# Patient Record
Sex: Male | Born: 1960 | Race: White | Hispanic: No | Marital: Married | Smoking: Never smoker
Health system: Southern US, Academic
[De-identification: ages and names within clinical notes are randomized; demographics above are authoritative.]

## PROBLEM LIST (undated history)

## (undated) ENCOUNTER — Inpatient Hospital Stay (HOSPITAL_COMMUNITY): Payer: 59 | Admitting: PULMONARY DISEASE

## (undated) DIAGNOSIS — K219 Gastro-esophageal reflux disease without esophagitis: Secondary | ICD-10-CM

## (undated) DIAGNOSIS — I10 Essential (primary) hypertension: Secondary | ICD-10-CM

## (undated) HISTORY — PX: KNEE SURGERY: SHX244

## (undated) HISTORY — PX: EYE SURGERY: SHX253

## (undated) HISTORY — PX: HX AORTIC VALVE REPLACEMENT: SHX41

## (undated) HISTORY — PX: FOOT SURGERY: SHX648

## (undated) HISTORY — DX: Gastro-esophageal reflux disease without esophagitis: K21.9

## (undated) HISTORY — PX: HX GALL BLADDER SURGERY/CHOLE: SHX55

## (undated) HISTORY — DX: Essential (primary) hypertension: I10

---

## 1993-11-16 ENCOUNTER — Other Ambulatory Visit (HOSPITAL_COMMUNITY): Payer: Self-pay | Admitting: FAMILY PRACTICE

## 2020-09-09 IMAGING — CT CT ABDOMEN & PELVIS WITH CONTRAST
1 of 2 series · 13 of 32 positions shown, 17 images · IV contrast (CONTRAST)
Comparison: None.
COMPARISON: None.

------------- REPORT [id number] -------------
EXAM:  [id number]   CT ABDOMEN & PELVIS WITH CONTRAST
INDICATION: 59-year-old with persistent right-sided abdominal pain.  Previous history of hernia repair.  No prior history of malignancy.
TECHNIQUE: CT was performed after oral contrast and intravenous administration of 75 mL Optiray 350 and reviewed in multiple projections.  Exam was performed using one or more of the following dose reduction techniques: Automated exposure control, adjustment of the mA and/or kV according to patient size, or the use of iterative reconstruction technique. Radiation dose 1903 mGy cm.
TECHNIQUE: CT was performed after oral contrast and intravenous administration of 75 mL Optiray 350 and reviewed in multiple projections.  Exam was performed using one or more of the following dose reduction techniques: Automated exposure control, adjustment of the mA and/or kV according to patient size, or the use of iterative reconstruction technique. Radiation dose 4567 mGy cm.

[delay · axial · delayed · 0.90mm/px · z∈[-989,-649]mm · 13 of 188 slices shown, 17 images]
[im 9/188  soft-tissue]
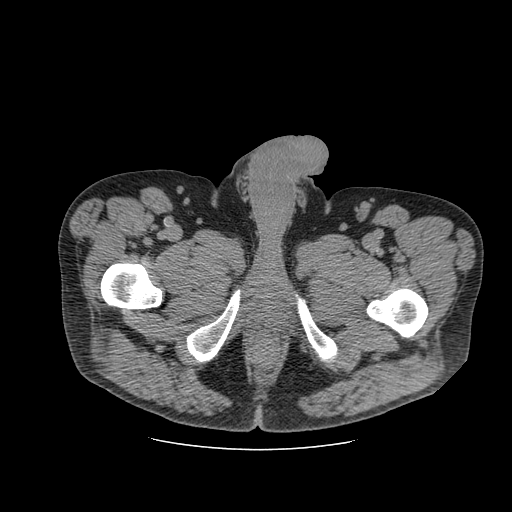
[im 9/188  bone]
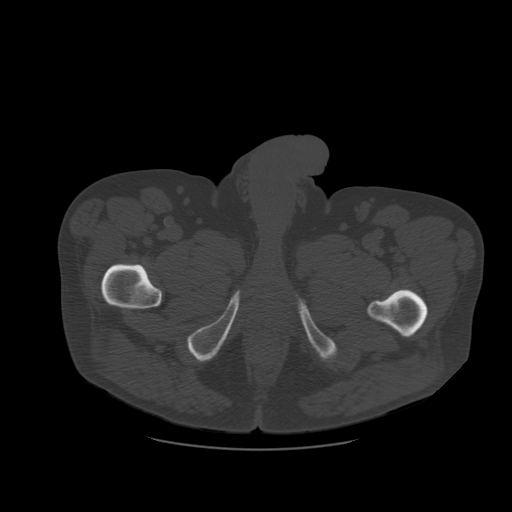
[im 27/188  soft-tissue]
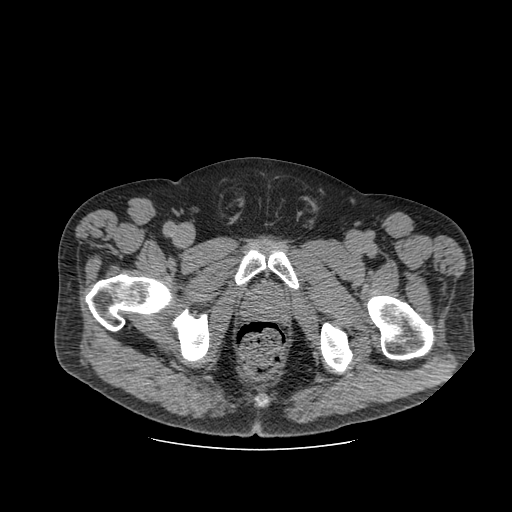
[im 45/188  soft-tissue]
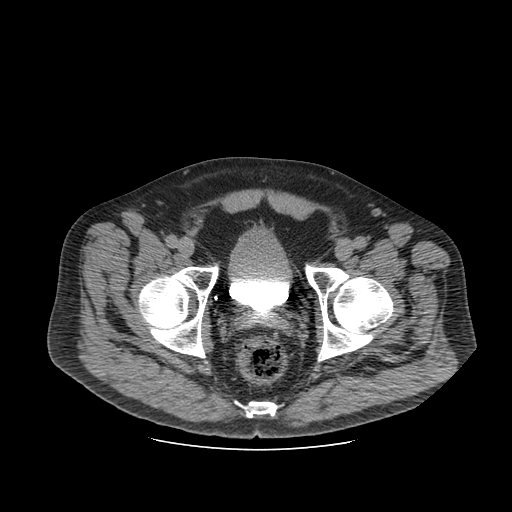
[im 63/188  soft-tissue]
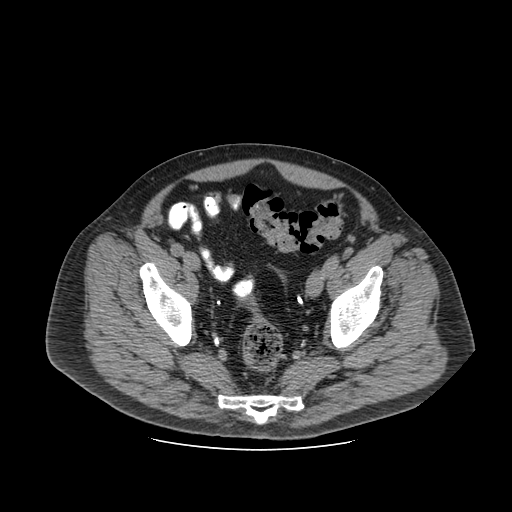
[im 81/188  soft-tissue]
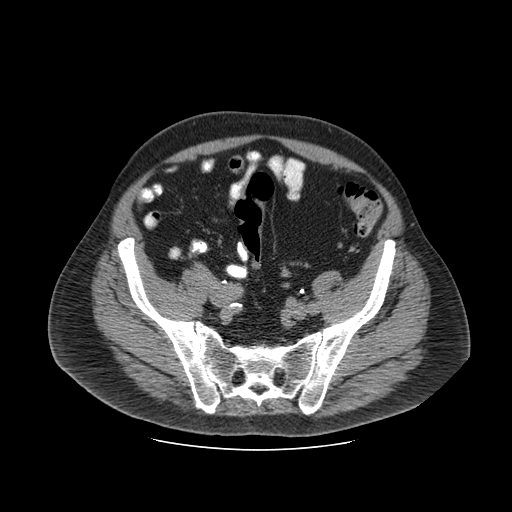
[im 98/188  soft-tissue]
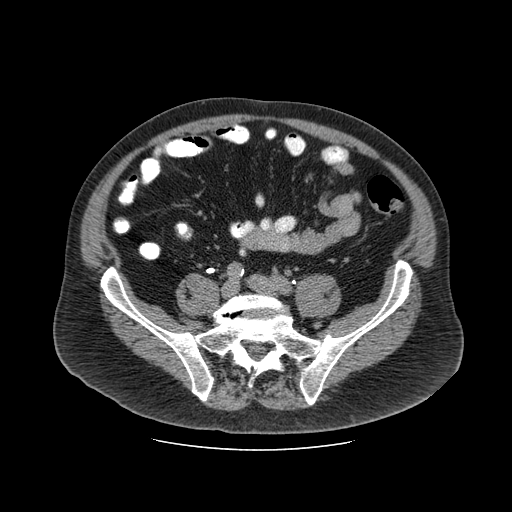
[im 107/188  soft-tissue]
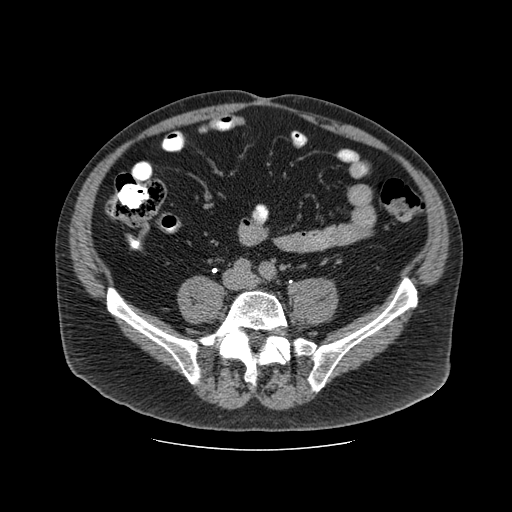
[im 125/188  soft-tissue]
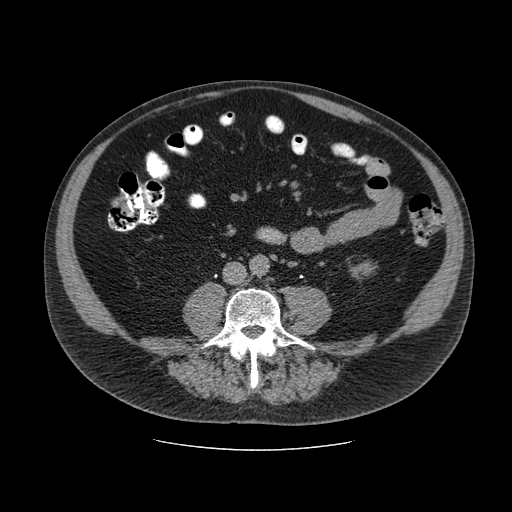
[im 143/188  soft-tissue]
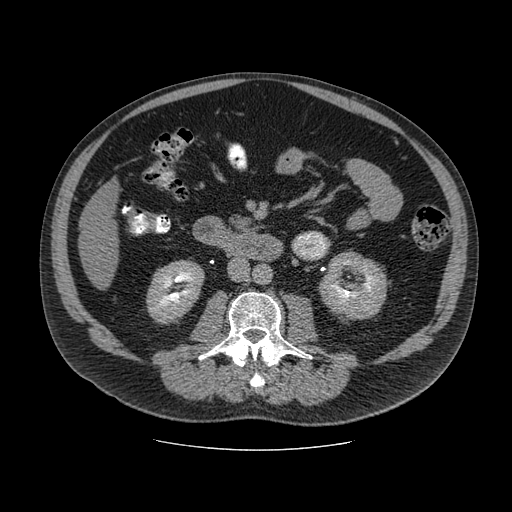
[im 143/188  bone]
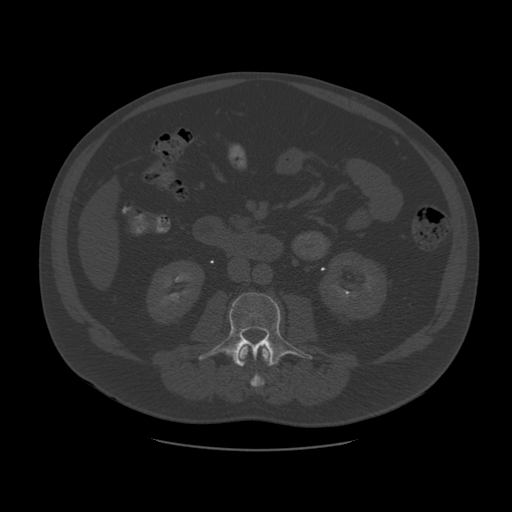
[im 152/188  lung]
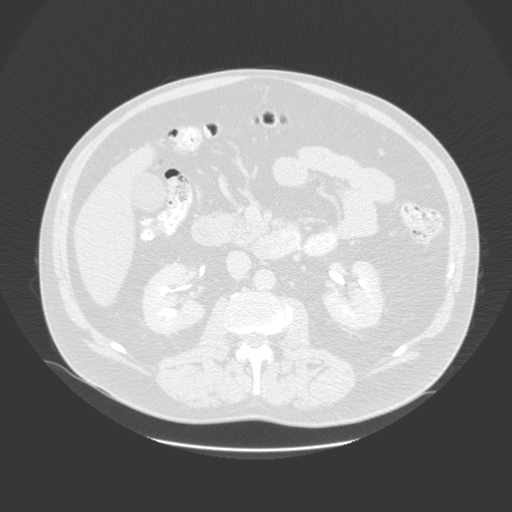
[im 161/188  soft-tissue]
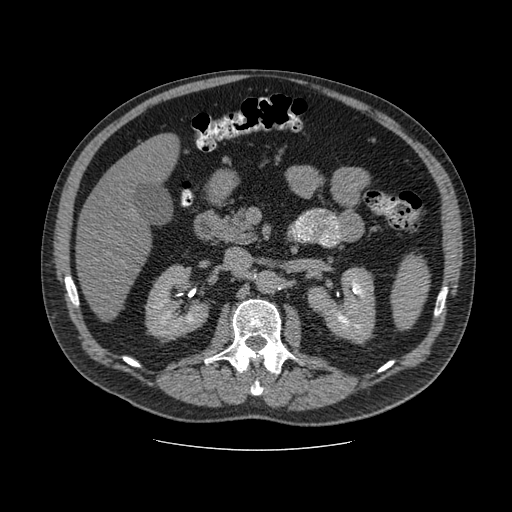
[im 161/188  lung]
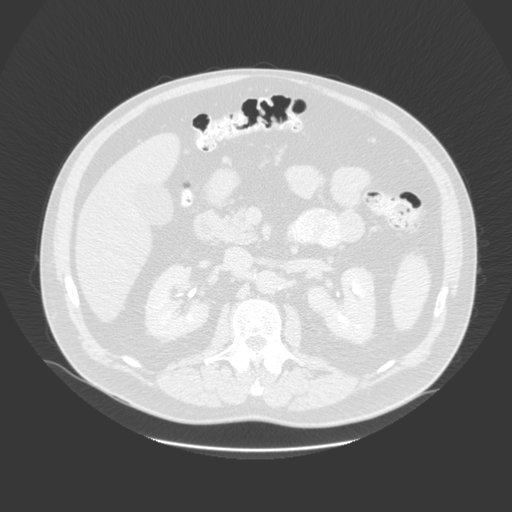
[im 170/188  lung]
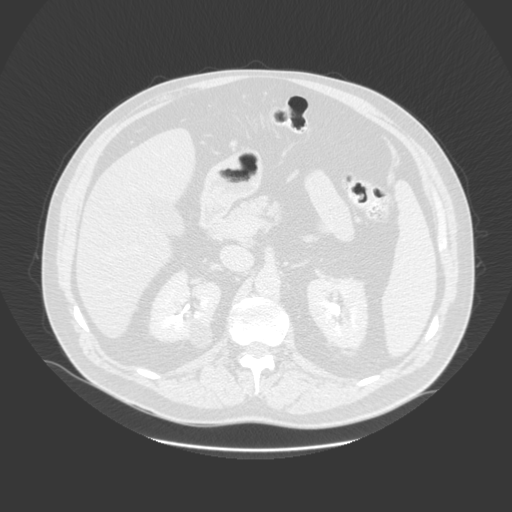
[im 179/188  soft-tissue]
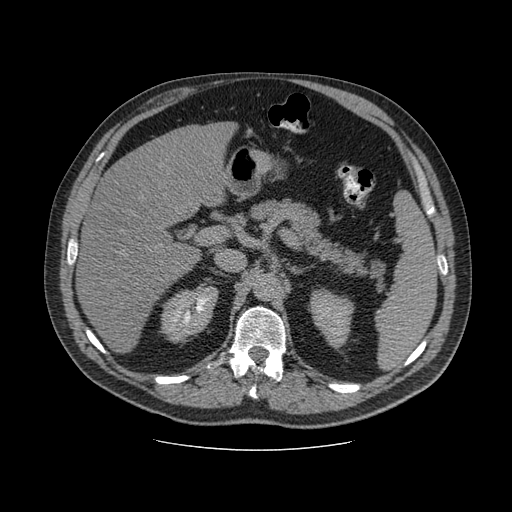
[im 179/188  lung]
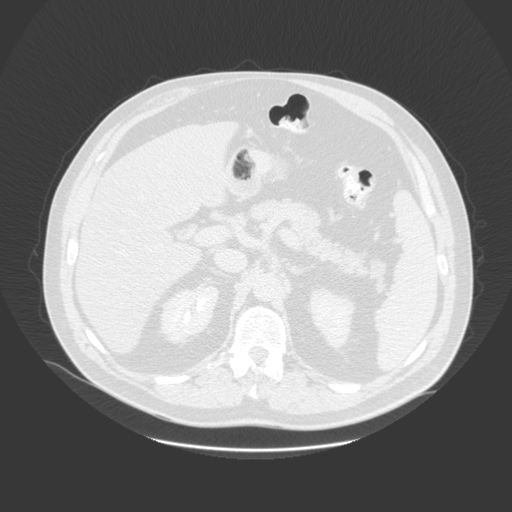

[13 of 32 positions shown; findings below may reference images not displayed]

FINDINGS: The lung bases do not show any acute findings.  Severe multivessel coronary artery calcifications involving proximal left anterior descending, circumflex and right coronary artery are noted.  Ectasia of ascending aorta measuring 40 mm in AP diameter at the root of the aorta. 

Mild hepatomegaly 18 cm in length with fatty changes of liver.  Normal sized spleen.  The gallbladder, bile ducts, pancreas do not show acute findings.  Adrenal glands are normal.  No obstructive changes of the kidneys are seen.  A 2 cm benign cyst of the posterior right kidney is noted.  No retroperitoneal adenopathy.  No evidence of bowel obstruction.  

Examination of the lower abdomen and pelvis shows moderate fecal impaction of the rectosigmoid colon.  No herniations or pelvic adenopathy is seen.  Degenerative disc disease of lumbar spine predominantly at L4-L5, L5-S1 levels.
IMPRESSION: 1.
Mild hepatomegaly with fatty liver. 
2.
Mild fecal impaction of distal colon at the rectum and sigmoid.  
3.
Degenerative disc changes of lower lumbar spine.  
4.
Significant multivessel coronary artery calcification.  Mild ectasia of root of the aorta measuring 4 cm in AP diameter.

------------- REPORT [id number] -------------
﻿EXAM:  [id number]   CT ABDOMEN & PELVIS WITH CONTRAST
FINDINGS: The lung bases do not show any acute findings.  Severe multivessel coronary artery calcifications involving proximal left anterior descending, circumflex and right coronary artery are noted.  Ectasia of ascending aorta measuring 40 mm in AP diameter at the root of the aorta. 

Mild hepatomegaly 18 cm in length with fatty changes of liver.  Normal sized spleen.  The gallbladder, bile ducts, pancreas do not show acute findings.  Adrenal glands are normal.  No obstructive changes of the kidneys are seen.  A 2 cm benign cyst of the posterior right kidney is noted.  No retroperitoneal adenopathy.  No evidence of bowel obstruction.  

Examination of the lower abdomen and pelvis shows moderate fecal impaction of the rectosigmoid colon.  No herniations or pelvic adenopathy is seen.  Degenerative disc disease of lumbar spine predominantly at L4-L5, L5-S1 levels.
IMPRESSION: 1. Mild hepatomegaly with fatty liver. 

2. Mild fecal impaction of distal colon at the rectum and sigmoid.  

3. Degenerative disc changes of lower lumbar spine.  

4. Significant multivessel coronary artery calcification.  Mild ectasia of root of the aorta measuring 4 cm in AP diameter.

## 2020-12-22 IMAGING — MR MRI KNEE LT W/O CONTRAST
4 of 5 series · 20 of 40 positions shown · IV contrast (gadolinium)
Comparison: None available.

﻿EXAM:  [id number]   MRI KNEE LT W/O CONTRAST
INDICATION: Chronic pain.
TECHNIQUE: Multiplanar multisequential MRI of the left knee joint was performed without gadolinium contrast.

[Series 5: PD fat-sat · axial · left · 4.0mm · 0.37mm/px · z∈[-110,+20]mm · 8 of 30 slices shown (1 of 3)]
[im 1/30]
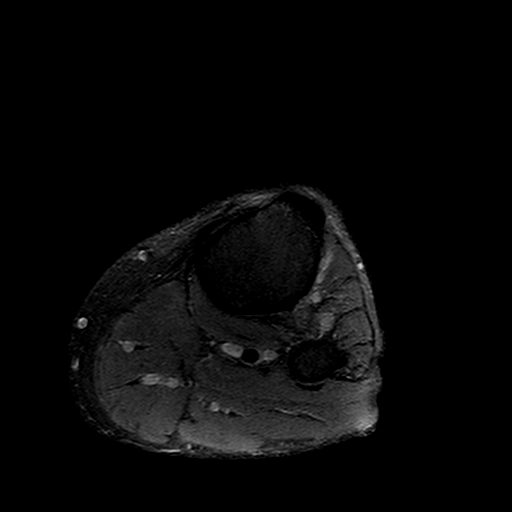
[im 5/30]
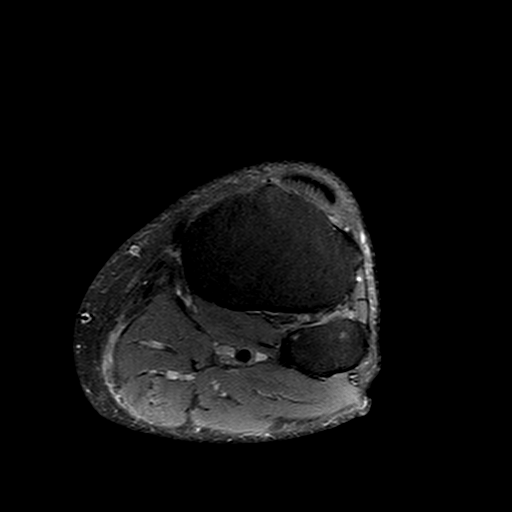
[im 9/30]
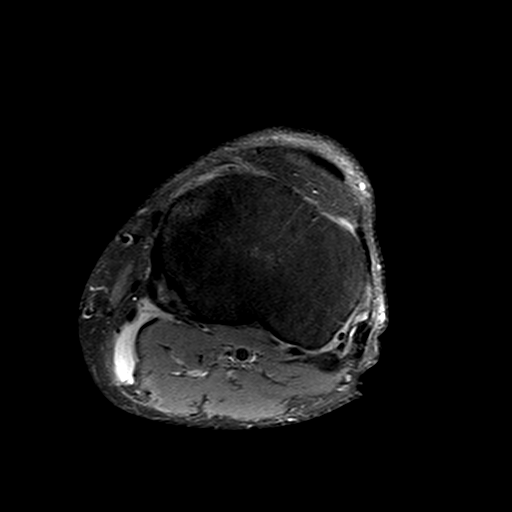
[im 13/30]
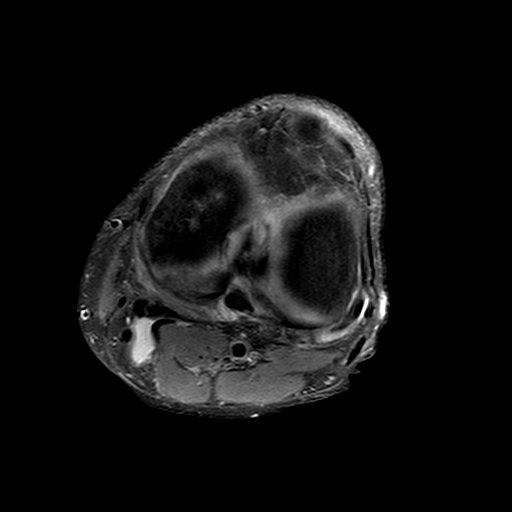
[im 17/30]
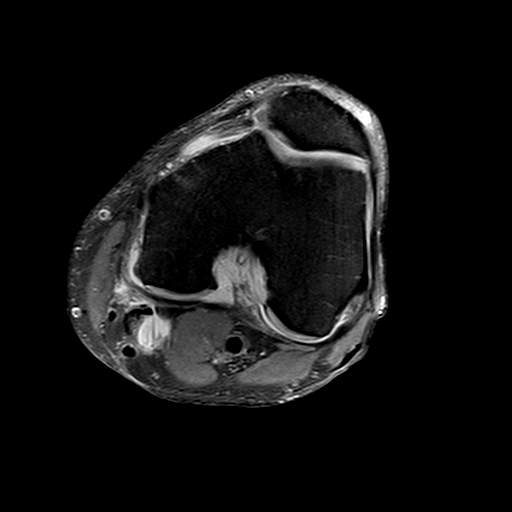
[im 21/30]
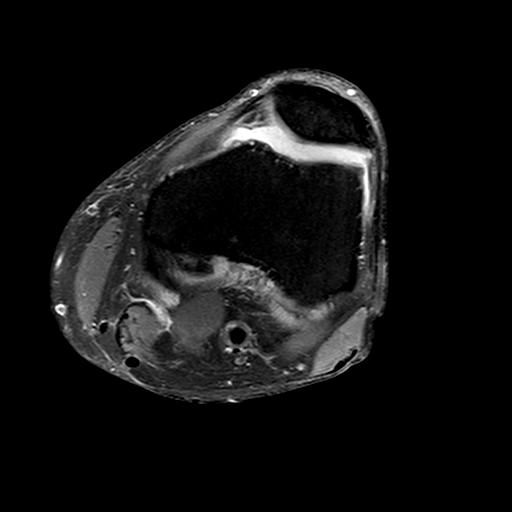
[im 25/30]
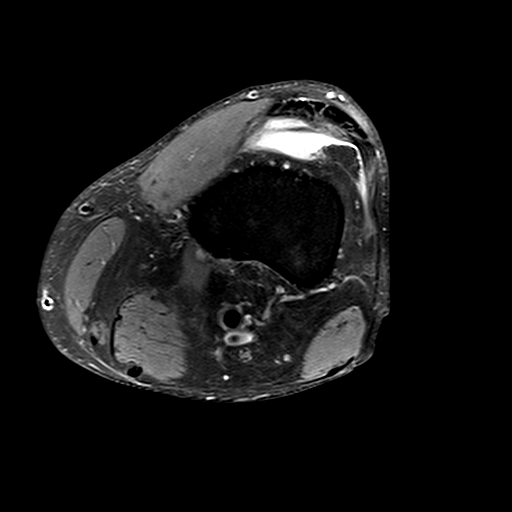
[im 30/30]
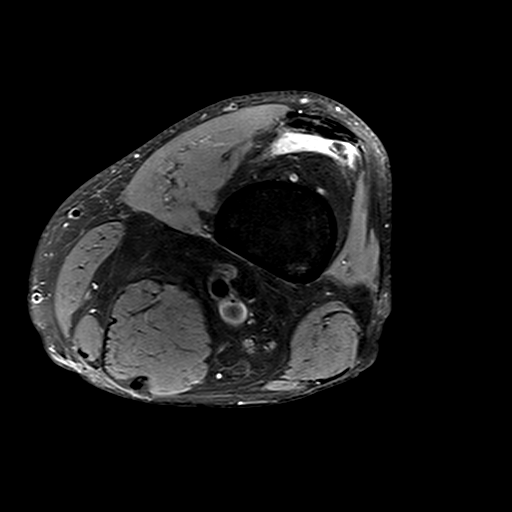

[Series 6: PD fat-sat · sagittal · left · 3.0mm · 0.29mm/px · 6 of 30 slices shown (2 of 3)]
[im 1/30]
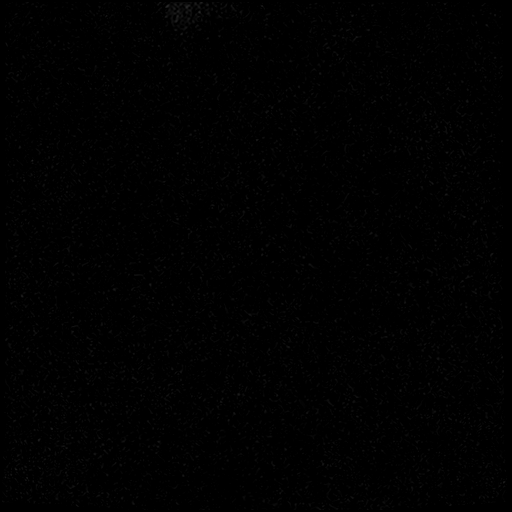
[im 5/30]
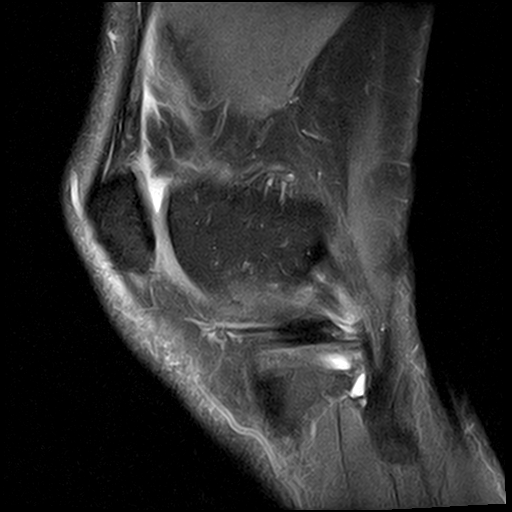
[im 9/30]
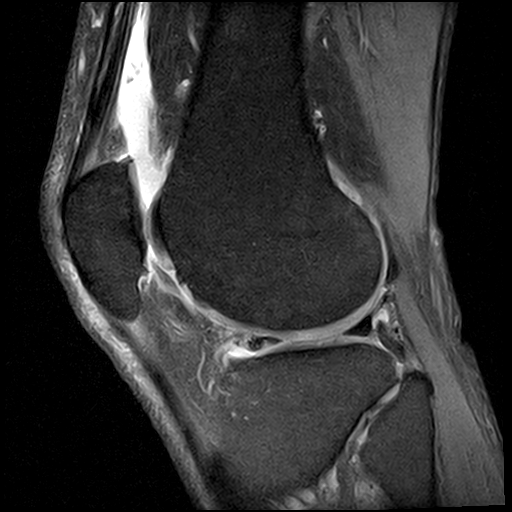
[im 13/30]
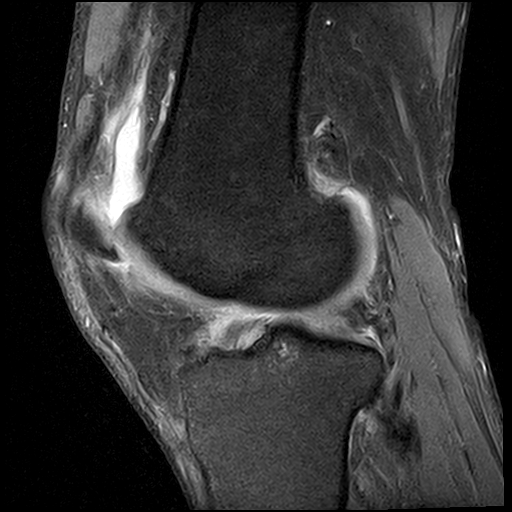
[im 17/30]
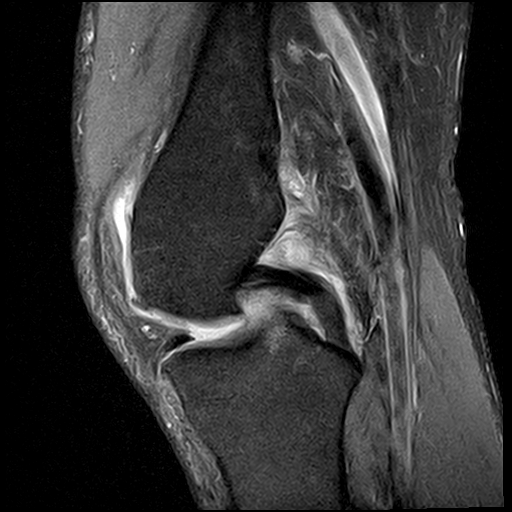
[im 25/30]
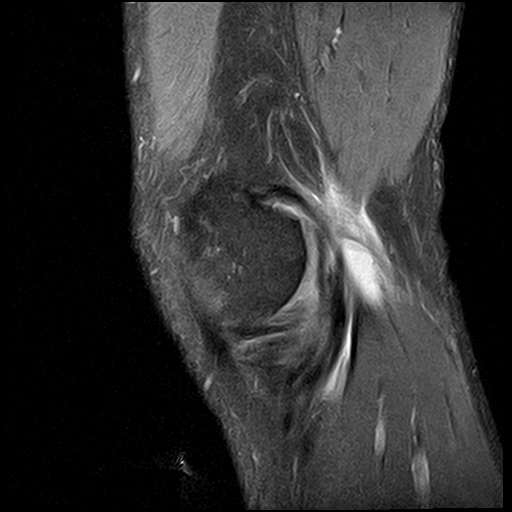

[Series 7: T1 · sagittal · left · 3.0mm · 0.29mm/px · 3 of 30 slices shown]
[im 5/30]
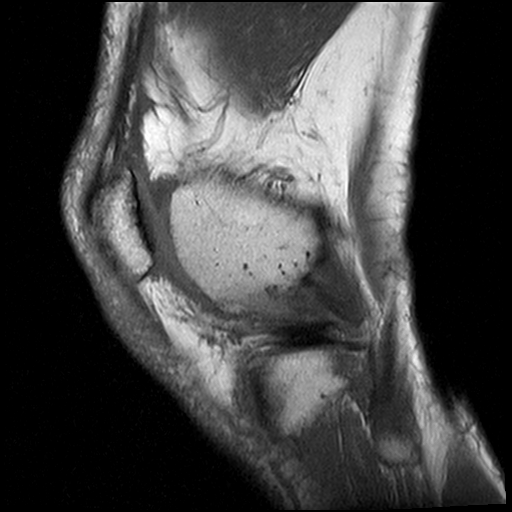
[im 17/30]
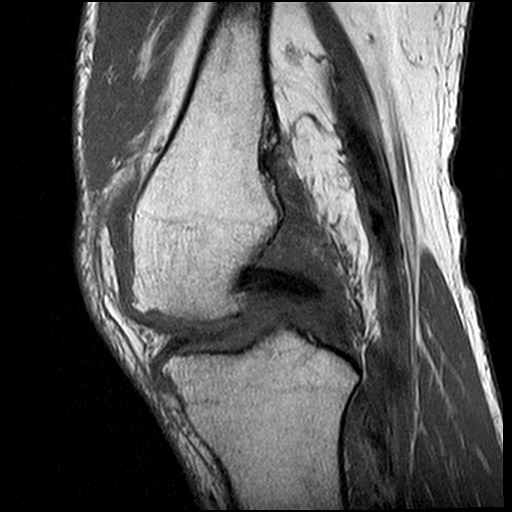
[im 25/30]
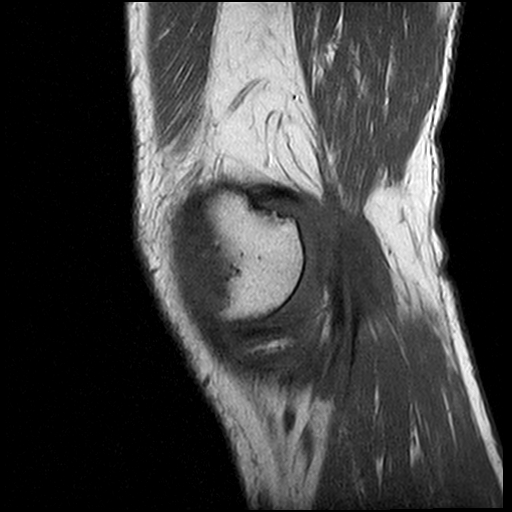

[Series 9: PD fat-sat · coronal · left · 3.0mm · 0.33mm/px · 3 of 29 slices shown (3 of 3)]
[im 5/29]
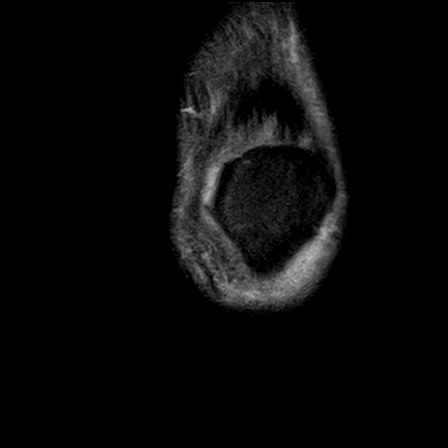
[im 17/29]
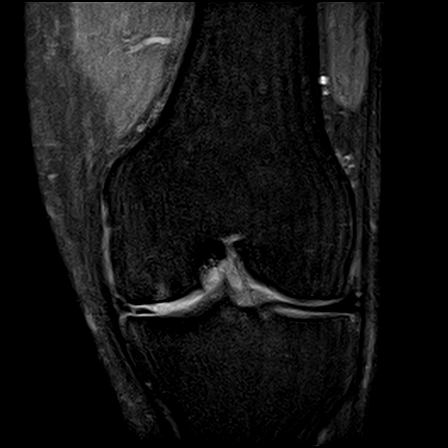
[im 25/29]
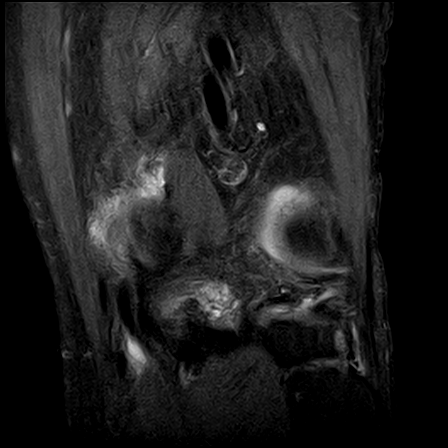

[20 of 40 positions shown; findings below may reference images not displayed]

FINDINGS: Examination is somewhat degraded due to excessive patient motion. There is a complex tear involving the body and posterior horn of the medial meniscus. There is also suggestion of a lateral meniscus root tear. The anterior cruciate ligament is abnormally thickened with T2 signal hyperintensity suggestive of mucoid degeneration. A posterior cruciate and collateral ligaments are intact, within normal limits in morphology and signal intensity. There is grade 4 chondromalacia of the medial tibiofemoral articulation. Extensor mechanism is intact. Capsular attachments appear unremarkable. Bone marrow signal intensity is normal. There is a small suprapatellar effusion. There is also a 6 cm Baker's cyst.
IMPRESSION: 1. Somewhat degraded exam due to excessive patient motion. Complex tear involving the body and posterior horn of the medial meniscus and suggestion of a lateral meniscus root tear. 

2. Mucoid degeneration of the anterior cruciate ligament. 

3. Grade 4 chondromalacia of the medial tibiofemoral articulation. 

4. Small suprapatellar effusion and a 6 cm Baker's cyst.

## 2020-12-24 IMAGING — MR MRI LUMBAR SPINE WITHOUT CONTRAST
5 of 6 series · 32 of 48 positions shown · IV contrast (gadolinium)
Comparison: Radiographs dated 08/18/2020.

﻿EXAM:  [id number]   MRI LUMBAR SPINE WITHOUT CONTRAST
INDICATION: Lower back pain with left lower extremity radiculopathy.
TECHNIQUE: Multiplanar multisequential MRI of the lumbar spine was performed without gadolinium contrast.

[Series 8: T2 · sagittal · 4.0mm · 1.02mm/px · 6 of 13 slices shown (1 of 3)]
[im 1/13]
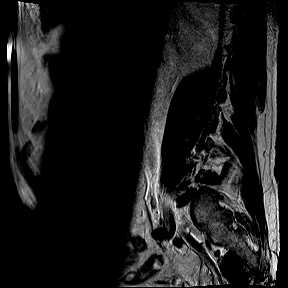
[im 3/13]
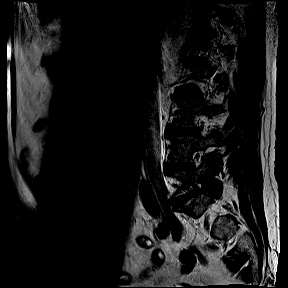
[im 5/13]
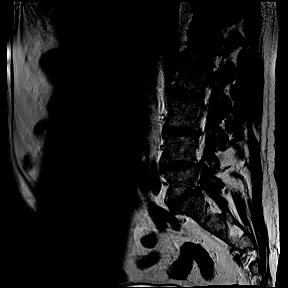
[im 8/13]
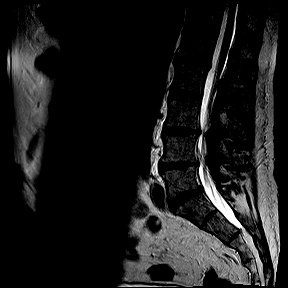
[im 10/13]
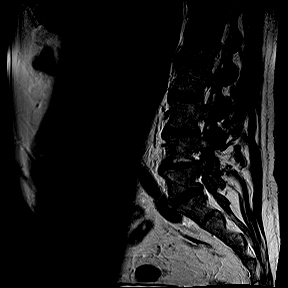
[im 13/13]
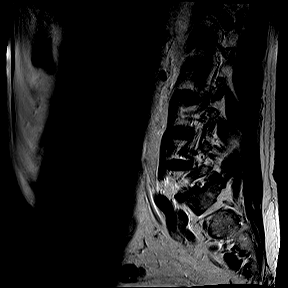

[Series 9: T1 · sagittal · 4.0mm · 1.02mm/px · 6 of 13 slices shown (1 of 2)]
[im 1/13]
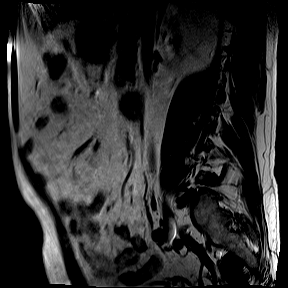
[im 3/13]
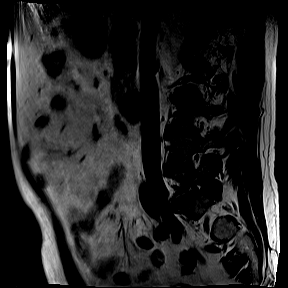
[im 5/13]
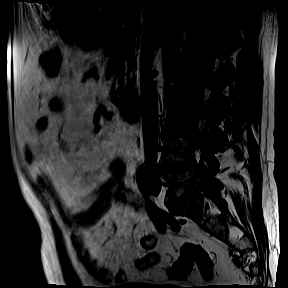
[im 8/13]
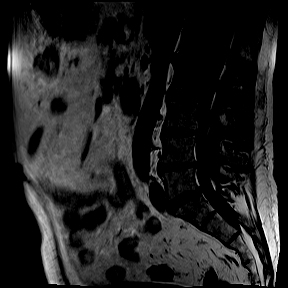
[im 10/13]
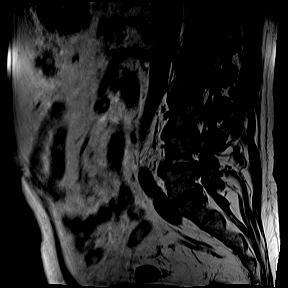
[im 13/13]
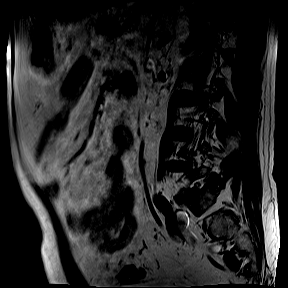

[Series 10: T2 · axial · 4.0mm · 0.52mm/px · z∈[-107,+103]mm · 11 of 23 slices shown (2 of 3)]
[im 1/23]
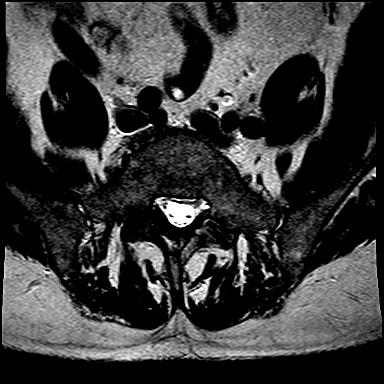
[im 3/23]
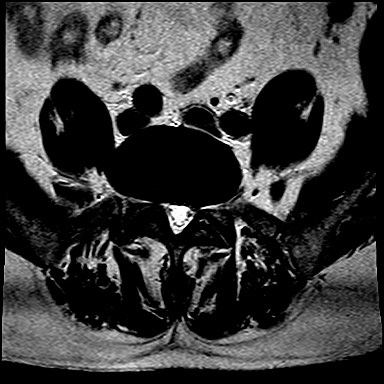
[im 5/23]
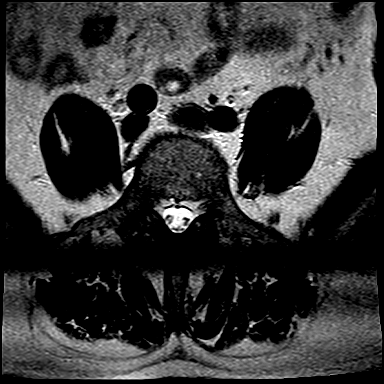
[im 7/23]
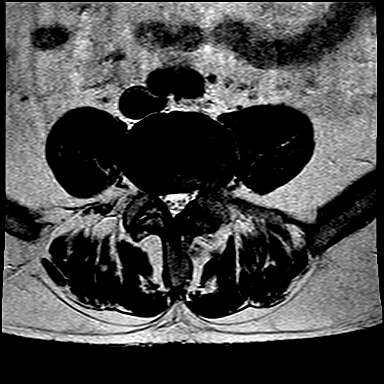
[im 9/23]
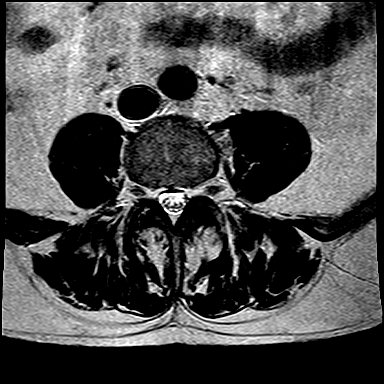
[im 12/23]
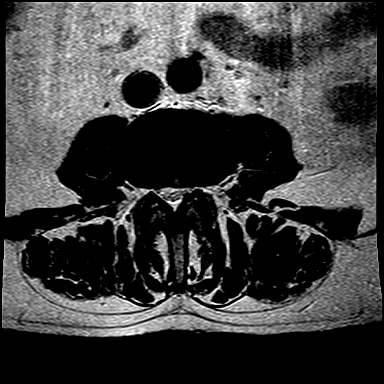
[im 14/23]
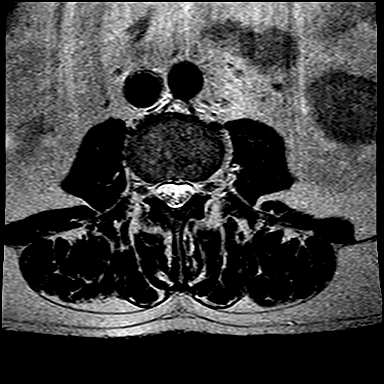
[im 16/23]
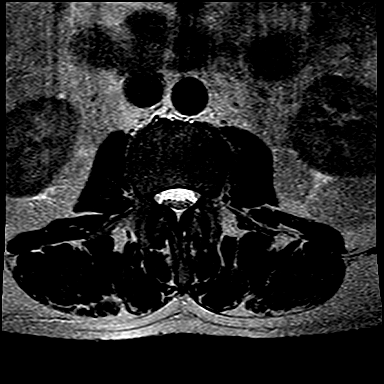
[im 18/23]
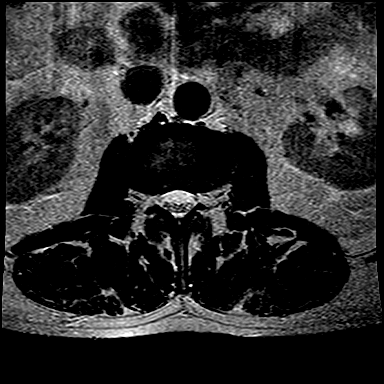
[im 20/23]
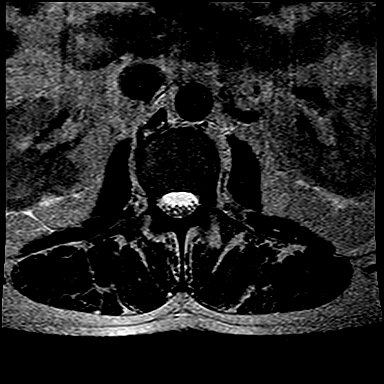
[im 23/23]
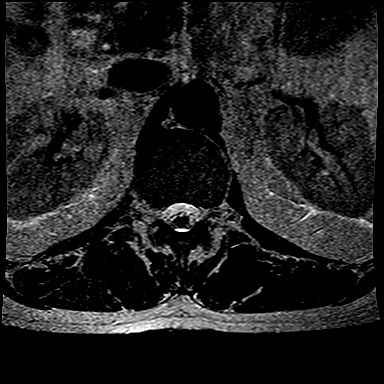

[Series 11: T1 · axial · 4.0mm · 0.52mm/px · 1 of 23 slices shown (2 of 2)]
[im 1/23]
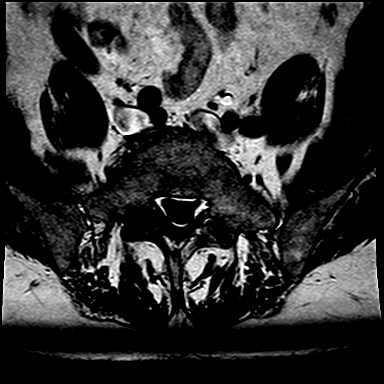

[Series 13: T2 · coronal · 5.0mm · 0.82mm/px · 8 of 18 slices shown (3 of 3)]
[im 1/18]
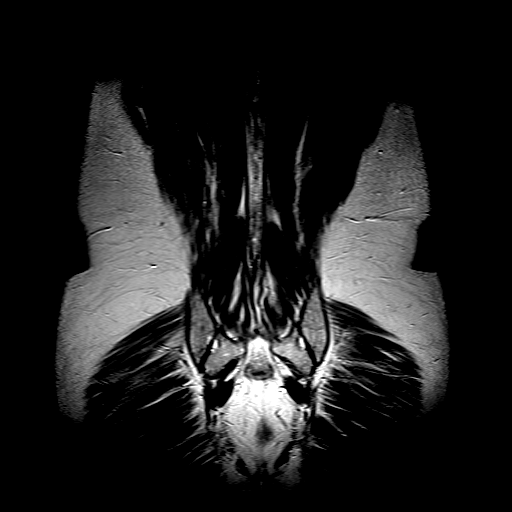
[im 3/18]
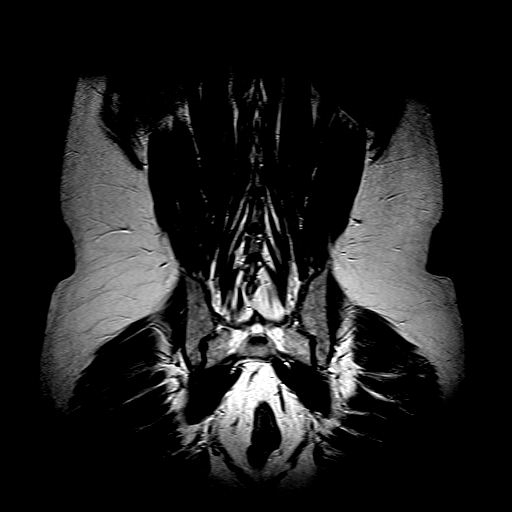
[im 5/18]
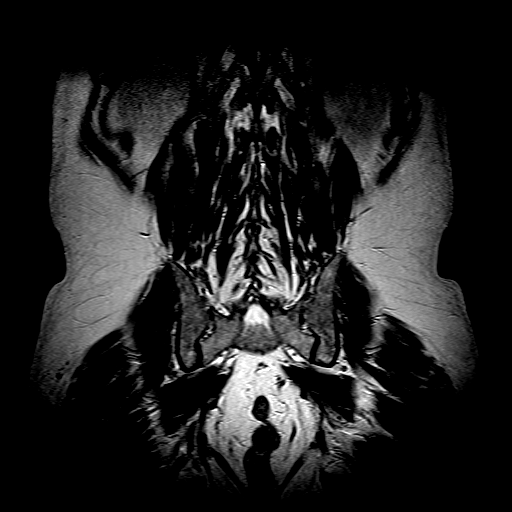
[im 8/18]
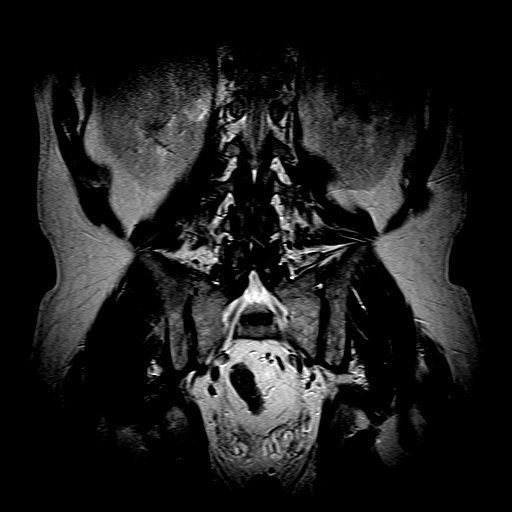
[im 10/18]
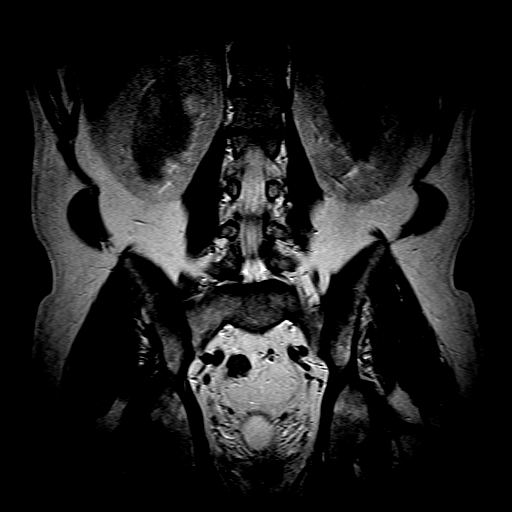
[im 13/18]
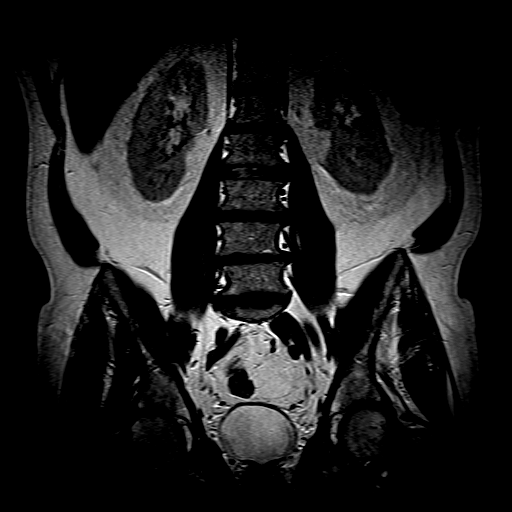
[im 15/18]
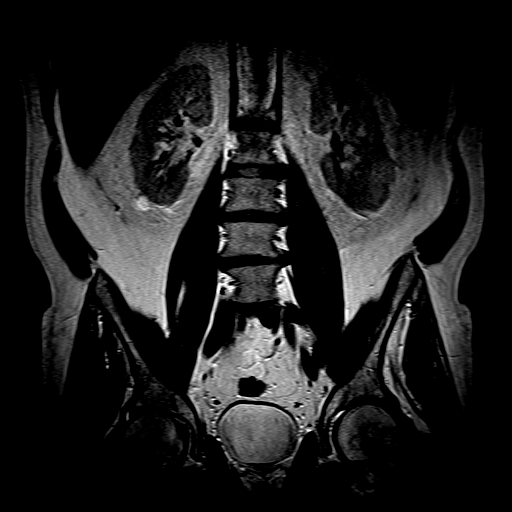
[im 18/18]
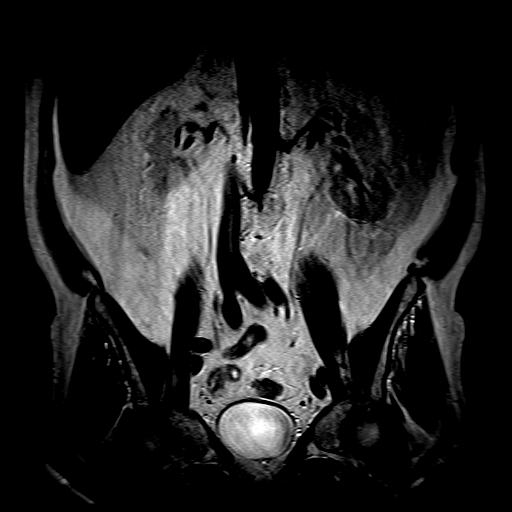

[32 of 48 positions shown; findings below may reference images not displayed]

FINDINGS: Vertebral bodies are normal in height, alignment and signal intensity. There is no acute fracture or subluxation. Distal spinal cord is normal in signal intensity and terminates normally at L1-2 disc space level. Spinal canal is congenitally narrow.

At L1-2 level, there is a small broad-based central disc bulge, mildly effacing the ventral thecal sac. There is no significant neural foraminal stenosis.

L2-3 level is unremarkable.

At L3-4 level, there is a small broad-based central and left paracentral disc bulge resulting in moderate spinal stenosis. There is mild bilateral neural foraminal stenosis from facet arthropathy and bulging annulus.

At L4-5 level, there is a small broad-based central disc bulge, mildly effacing the ventral thecal sac. There is mild-to-moderate left neural foraminal stenosis from facet arthropathy and bulging annulus.

At L5-S1 level, there is a small broad-based central disc bulge, mildly effacing the ventral thecal sac. There is moderate to severe right and moderate left neural foraminal stenosis from facet arthropathy and bulging annulus.

Paraspinal soft tissues are unremarkable.
IMPRESSION: 1. Small disc bulges at multiple levels with moderate spinal stenosis at L3-4 level. 

2. Multilevel neural foraminal stenosis as detailed above.

## 2021-07-22 ENCOUNTER — Other Ambulatory Visit (HOSPITAL_COMMUNITY): Payer: Self-pay

## 2021-07-22 DIAGNOSIS — Z96652 Presence of left artificial knee joint: Secondary | ICD-10-CM

## 2021-07-26 ENCOUNTER — Ambulatory Visit (HOSPITAL_COMMUNITY)
Admission: RE | Admit: 2021-07-26 | Discharge: 2021-07-26 | Disposition: A | Discharge status: Still In Hospital | Payer: 59 | Source: Ambulatory Visit

## 2021-07-26 ENCOUNTER — Other Ambulatory Visit: Payer: Self-pay

## 2021-07-26 DIAGNOSIS — Z96652 Presence of left artificial knee joint: Secondary | ICD-10-CM | POA: Insufficient documentation

## 2021-07-26 NOTE — PT Evaluation (Signed)
[facility]  Outpatient Physical Therapy  [address]  [place], [address]  [phone]  (Fax) [phone]      Physical Therapy Lower Extremity Evaluation    Date: 07/26/2021  Patient's Name: Billy Aguirre  Date of Birth: [birth year]    Referral Diagnosis: Left TKA  Time In: 1528 Time Out: 1605             SUBJECTIVE  Chief Complaint and Symptoms: Patient notes profuse bruising in left thigh.  He is currently using a walker.  He notes difficulty is sit <> stands.  Current pain rating: 5/10, Worst pain in the past week: 10/10, Least amount of pain in the past week: 2/10.  He denies current episodes of left knee buckling.  He notes that he does have some numbness and tingling in his left foot (but has had recent surgery in left foot/ankle with plates).    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  He has previously used assistive devices.  He has had previous episodes of left knee buckling that did cause falls.  He does report mild knee buckling on his right LE since surgery.      Diagnostic tests: None since surgery    PLOF and Current Limitations: Patient reports previous buckling, near constant pain and joint noise.  He notes previous difficulty bending and straightening his left knee.    Sleep affected: Patient notes that he is having difficulty sleeping and is sleeping in a recliner (normal for him).    Occupational/Recreational Activities: Patient is employed at [employer] where he walks on concrete, goes up and down stairs and ladders.  He currently works 8 or 16 hours shifts.  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  To return to work he has to be able to push a sled, walk up and down ladder, and pull.      Living Situation: Patient lives in a home with single level living available with his wife, who is  able to help.  He does have steps to enter his home (at minimum 1 step).    Previous episodes/treatments: N/A    Next MD visit: 5/8 or 9th    Significant PMH: 2 previous right knee surgery, bilateral foot bunionectomy; please see chart for ambulatory care record sheet   No past medical history on file.    Patient goals: Get back to 100%    Patient-Specific Functional Score:  Problem Score   1. 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 0   Total: 0          OBJECTIVE    Posture:holds knee in slight flexion with some hip ER    AROM   Right Left   Knee Flexion 122 73   Knee Extension 0 10     ROM comments Very guarded throughout    Strength MMT (/5)  Hip  Right Left   Flexion 4+ 2+     Knee  Right Left   Extension 5 2   Flexion 4+ 2     Ankle Right Left   Dorsiflexion 5 3     Gait: Decreased weight shift to left with decreased right step length, decreased gait speed, decreased left knee extension in  stance and knee flexion in swing phase with FWW    TUG: 24.91 seconds with FWW    Treatment provided: REVIEW OF POC AND GOALS WITH PATIENT, ALL QUESTIONS ANSWERED and PATIENT EDUCATION   Reviewed and provided copy of hospital HEP with focus on knee flexion and extension exercises          ASSESSMENT  61 y.o. man presents to outpatient PT s/p left TKA on 07/23/21.  Patient demonstrates deficits in left knee AROM, strength, gait, balance, and functional ability.  He will benefit from outpatient PT to address his above deficits to reach his highest functional level possible.    Patient tolerated evaluation well this afternoon without adverse effects.  He dverbalized understanding of HEP and patient education. He was quite guarded with attempts at gentle PROM.    Rehab potential: GOOD    Goals:  STGs: 3-4 Weeks  1.  Patient will demonstrate improved left knee AROM to at least 0 to 120 degrees to aid in ability to ambulate up and down stairs.  2.  Patient will demonstrate independence with progressive  HEP to maximize gains from physical therapy.  3.  Patient will report max 6/10 pain to aid in completion of ADLs/work duties.    LTGs: 6-8 Weeks  1.  Patient will demonstrate improved left knee strength to at least 5/5 aid in functional transfers and return to full work duties.  2.  Patient will demonstrate improved TUG time of maximum 13.5 seconds with least restrictive assistive device to demonstrate decreased falls risk.  3.  Patient will demonstrate 10 squats/sit <> stands with 5# and good form to aid in completion of ADLs.  4.  Patient will demonstrate improved functional ability with Patient Specific Scale Score of at least 2.  5.  Patient will demonstrate ability to ambulate up and down stairs with reciprocal pattern and without use of UEs to aid in community ambulation.    PLAN  Patient will attend 3 times per week x 2-3 weeks then 2x per week for 5-6 weeks. Therapy may include, but is not limited to THERAPEUTIC EXERCISES, MYOFASCIAL/JOINT MOBILIZATION, POSTURE/BODY MECHANICS, ERGONOMIC TRAINING, TRANSFER/GAIT TRAINING, HOME INSTRUCTIONS, HEAT/COLD, ELECTRICAL STIMULATION, KINESIOTAPE and NEURO RE-EDUCATIOIN    At next visit: Please start with program focused on gentle ROM for flexion and extension to left knee as well as left quad/hamstring strengthening as able.     Evaluation complexity:   Personal factors impacting POC: OCCUPATIONAL ADLS (IE HEAVY LIFTING, REPETITIVE TASKS, LONG HOURS)   Co-morbidities impacting POC: PREVIOUS SURGERIES  Complexity of physical exam: INCLUDING MUSCULOSKELETAL SYSTEM (POSTURE, ROM, STRENGTH, HEIGHT/WEIGHT) and INCLUDING NEUROMUSCULAR EXAM (BALANCE, GAIT, LOCOMOTION, MOBILITY)   Clinical Presentation: STABLE   Evaluation Complexity: LOW-HISTORY 0, EXAMINATION 1-2, STABLE PRESENTATION     Intervention minutes: EVALUATION 37 minutes    Samella Parr, PT  07/26/2021, 15:27    Start of Service: _________          Certification:    From:______  Through:_________    I certify the  need for these services furnished under this plan of treatment and while under my care.    Referring Provider Signature: _______________     Date : _____________________

## 2021-07-28 ENCOUNTER — Other Ambulatory Visit: Payer: Self-pay

## 2021-07-28 ENCOUNTER — Ambulatory Visit (HOSPITAL_COMMUNITY)
Admission: RE | Admit: 2021-07-28 | Discharge: 2021-07-28 | Disposition: A | Discharge status: Still In Hospital | Payer: 59 | Source: Ambulatory Visit

## 2021-07-28 NOTE — PT Treatment (Signed)
[facility]  Outpatient Physical Therapy  [address]  [place], [address]  [phone]  (Fax) [phone]    Physical Therapy Treatment Note    Date: 07/28/2021  Patient's Name: Billy Aguirre  Date of Birth: [birth year]    Visit # 2 of up to 21 planned  Authorization: Medical necessity   Plan of Care Signed?: No  Plan of Care Ends: 09/06/21  Next Progress Note Due:  08/23/21    Evaluating Physical Therapist: Florentina Addison  PT Diagnosis: s/p left TKA  Next Scheduled (Referring) Physician Appointment: 5/8 or 5/9  Allergies/Contraindications: N/A    Time In: 0759 Time Out: 0848    Subjective: Patient notes that his knee is stiff and moderately sore this morning.  He notes that he isn't doing his exercises in a routine yet.    Objective:   Progressed exercises/therapy: Initiated treatment per below  Cuing required: min A Verbal, visual, and tactile cues for completion of exercises with correct form  Objective measure(s):   Initial left knee AROM: 10 -65 degrees  Exercise/Activity Type Repetitions Resistance Last Performed   Nu-Step   5' L1 5/3   DKTC with Swiss ball    2x20  5/3   Swiss ball bridges (ball under calves)   2x10  5/3   LAQ   2x10  5/3   Hamstring curls (seated)   2x10 Yellow TB 5/3   Gentle PROM   Flexion and extension  5/3   CP 10'  5/3   Patient education: Advised patient get into a schedule with exercises including specific advice for completion after PT today    Assessment: Patient tolerated treatment well without adverse effects.  He remains quite guarded with all PROM.  His measurements showed minimal change before/after treatment today.  He reported pain relief with CP.    Plan: Continue with emphasis on ROM    Goals:    STGs: 3-4 Weeks  1.  Patient will demonstrate improved left knee AROM to at least 0 to 120 degrees to aid in ability to ambulate up and down stairs.  2.  Patient will demonstrate independence with progressive HEP to maximize gains from physical  therapy.  3.  Patient will report max 6/10 pain to aid in completion of ADLs/work duties.    LTGs: 6-8 Weeks  1.  Patient will demonstrate improved left knee strength to at least 5/5 aid in functional transfers and return to full work duties.  2.  Patient will demonstrate improved TUG time of maximum 13.5 seconds with least restrictive assistive device to demonstrate decreased falls risk.  3.  Patient will demonstrate 10 squats/sit <> stands with 5# and good form to aid in completion of ADLs.  4.  Patient will demonstrate improved functional ability with Patient Specific Scale Score of at least 2.  5.  Patient will demonstrate ability to ambulate up and down stairs with reciprocal pattern and without use of UEs to aid in community ambulation.    Total Session Time 49, Timed code minutes 39 and Untimed code minutes 10  THERAPEUTIC EXERCISE 39 minutes    Samella Parr, PT  07/28/2021, 08:03

## 2021-08-02 ENCOUNTER — Ambulatory Visit (HOSPITAL_COMMUNITY)
Admission: RE | Admit: 2021-08-02 | Discharge: 2021-08-02 | Disposition: A | Discharge status: Still In Hospital | Payer: 59 | Source: Ambulatory Visit

## 2021-08-02 ENCOUNTER — Other Ambulatory Visit: Payer: Self-pay

## 2021-08-02 NOTE — PT Treatment (Signed)
[facility]  Outpatient Physical Therapy  [address]  [place], [address]  [phone]  (Fax) [phone]    Physical Therapy Treatment Note    Date: 08/02/2021  Patient's Name: Billy Aguirre  Date of Birth: 1961/02/06            Visit #/POC: 3  Authorization:       Evaluating Physical Therapist: Bridgett Larsson DPT  PT diagnosis/Reason for Referral: L TKA   Next Scheduled Physician Appointment:            Subjective: Pt into clinic today using cane.  States he is doing exercises at home and "some extra stuff.  States knee is sore and rates worst pain 6/10 over past week. Discussed importance of walking without a limp.     Objective: Warm up on Nustep followed by activities as noted     Exercise/Activity Type Repetitions Resistance Last Performed   Nu-Step   5' L1  yes   DKTC with Swiss ball    2x20   no   Swiss ball bridges (ball under calves)   2x10  no   LAQ   2x10   yes   Hamstring curls (seated)   2x10 Yellow TB  yes   Gentle PROM   Flexion and extension   yes    SKC with ball  15  assist and slight overpressure  yes               CP 10'   no             Assessment: Pt tolerated well.  Continues with limited ROM and pain at end ranges.      Plan: Will continue and progress as tolerated    Total Session Time 35 and Timed code minutes 35  Therapeutic exercises 35 min      Billy Aguirre, PTA  08/02/2021, 14:58

## 2021-08-04 ENCOUNTER — Other Ambulatory Visit: Payer: Self-pay

## 2021-08-04 ENCOUNTER — Ambulatory Visit (HOSPITAL_COMMUNITY)
Admission: RE | Admit: 2021-08-04 | Discharge: 2021-08-04 | Disposition: A | Discharge status: Still In Hospital | Payer: 59 | Source: Ambulatory Visit

## 2021-08-04 NOTE — PT Treatment (Signed)
[facility]  Outpatient Physical Therapy  [address]  [place], [address]  [phone]  (Fax) [phone]    Physical Therapy Treatment Note    Date: 08/04/2021  Patient's Name: REDFORD BEHRLE  Date of Birth: [birth year]    Visit # 4 of up to 21 planned  Authorization: Medical necessity   Plan of Care Signed?: No  Plan of Care Ends: 09/06/21  Next Progress Note Due:  08/23/21    Evaluating Physical Therapist: Florentina Addison  PT Diagnosis: s/p left TKA  Next Scheduled (Referring) Physician Appointment: 08/31/21  Allergies/Contraindications: N/A    Time In: 1453 Time Out: 1526    Subjective: Patient notes that his right knee is a little bit painful today, rating his pain 2/10.  He states that his RTD went well yesterday.  He continues to present with antalgic gait with SPC.    Objective:   Progressed exercises/therapy: Progressed treatment per below  Cuing required: min A Verbal, visual, and tactile cues for completion of exercises with correct form  Objective measure(s):   Initial left knee AROM: 12 -81 degrees  Ending left knee AROM: 7 - 90 degrees  Exercise/Activity Type Repetitions Resistance Last Performed   Nu-Step   5' L3 5/10   DKTC with Swiss ball    2x20  5/10   Swiss ball bridges (ball under calves)   2x10  5/3   LAQ   1x10  1x10 0#  2# 5/10   Hamstring curls (seated)   2x10x3" Yellow TB 5/10   Gentle PROM   Flexion and extension  5/10    SKC with ball  15  assist and slight overpressure  5/8   Hooklying hamstring stretch left 2x30 seconds mild pressure from PT 5/10   Knee extension stretch at stairs 5x5"  5/10   CP 10'   no   Patient education: Reinforced education provided at last treatment session that patient should not progress to using a cane until he is able to walk with a more normal gait pattern for safety (prevent falls and injury)    Assessment: Patient tolerated treatment well without adverse effects.  Patient remains limited particularly into  extension, though this is most noticeable with ambulation.  He did show good improvement in his left knee AROM into both flexion and extension.    Plan: Continue with emphasis on ROM    Goals:    STGs: 3-4 Weeks  1.  Patient will demonstrate improved left knee AROM to at least 0 to 120 degrees to aid in ability to ambulate up and down stairs.  2.  Patient will demonstrate independence with progressive HEP to maximize gains from physical therapy.  3.  Patient will report max 6/10 pain to aid in completion of ADLs/work duties.    LTGs: 6-8 Weeks  1.  Patient will demonstrate improved left knee strength to at least 5/5 aid in functional transfers and return to full work duties.  2.  Patient will demonstrate improved TUG time of maximum 13.5 seconds with least restrictive assistive device to demonstrate decreased falls risk.  3.  Patient will demonstrate 10 squats/sit <> stands with 5# and good form to aid in completion of ADLs.  4.  Patient will demonstrate improved functional ability with Patient Specific Scale Score of at least 2.  5.  Patient will demonstrate ability to ambulate up and down stairs with reciprocal pattern and without use of UEs to aid in community ambulation.  Total Session Time 33 and Timed code minutes 33  THERAPEUTIC EXERCISE 33 minutes    Samella Parr, PT  08/04/2021, 14:59

## 2021-08-06 ENCOUNTER — Ambulatory Visit (HOSPITAL_COMMUNITY)
Admission: RE | Admit: 2021-08-06 | Discharge: 2021-08-06 | Disposition: A | Discharge status: Still In Hospital | Payer: 59 | Source: Ambulatory Visit

## 2021-08-06 ENCOUNTER — Other Ambulatory Visit: Payer: Self-pay

## 2021-08-06 NOTE — PT Treatment (Signed)
[facility]  Outpatient Physical Therapy  [address]  [place], [address]  [phone]  (Fax) [phone]    Physical Therapy Treatment Note    Date: 08/06/2021  Patient's Name: Billy Aguirre  Date of Birth: 08/30/1960    Visit # 4 of up to 21 planned  Authorization: Medical necessity   Plan of Care Signed?: No  Plan of Care Ends: 09/06/21  Next Progress Note Due:  08/23/21    Evaluating Physical Therapist: Florentina Addison  PT Diagnosis: s/p left TKA  Next Scheduled (Referring) Physician Appointment: 08/31/21  Allergies/Contraindications: N/A    Time In: 1450 Time Out: 1525    Subjective: Patient rates pain today "1.5/10".     Objective:   Progressed exercises/therapy: Progressed treatment per below  Cuing required: min A Verbal, visual, and tactile cues for completion of exercises with correct form  Objective measure(s):   Left knee AROM 10-95 end of session  Exercise/Activity Type Repetitions Resistance Last Performed   Nu-Step   5' L3 5/12   DKTC with Swiss ball    20 reps  5/12   Swiss ball bridges (ball under calves)   2x10  5/3   LAQ   3 x 10 2# 5/12   Hamstring curls (seated)   2x10x3" Yellow TB 5/10   Gentle PROM   Flexion and extension  5/10    SKC with ball  15  assist and slight overpressure  5/12   Hooklying hamstring stretch left 5 x 15 strap 5/12   Knee extension stretch at stairs 5x10"  5/12   CP 10'   no       Assessment: Patient more restricted with knee extension today. He tolerated session well with no adverse effects reported.     Plan: Continue with emphasis on ROM    Goals:    STGs: 3-4 Weeks  1.  Patient will demonstrate improved left knee AROM to at least 0 to 120 degrees to aid in ability to ambulate up and down stairs.  2.  Patient will demonstrate independence with progressive HEP to maximize gains from physical therapy.  3.  Patient will report max 6/10 pain to aid in completion of ADLs/work duties.    LTGs: 6-8 Weeks  1.  Patient will  demonstrate improved left knee strength to at least 5/5 aid in functional transfers and return to full work duties.  2.  Patient will demonstrate improved TUG time of maximum 13.5 seconds with least restrictive assistive device to demonstrate decreased falls risk.  3.  Patient will demonstrate 10 squats/sit <> stands with 5# and good form to aid in completion of ADLs.  4.  Patient will demonstrate improved functional ability with Patient Specific Scale Score of at least 2.  5.  Patient will demonstrate ability to ambulate up and down stairs with reciprocal pattern and without use of UEs to aid in community ambulation.    Total Session Time 35 and Timed code minutes 35  THERAPEUTIC EXERCISE 35 minutes    Leiah Giannotti, PTA  08/06/2021, 15:34

## 2021-08-09 ENCOUNTER — Ambulatory Visit (HOSPITAL_COMMUNITY)
Admission: RE | Admit: 2021-08-09 | Discharge: 2021-08-09 | Disposition: A | Discharge status: Still In Hospital | Payer: 59 | Source: Ambulatory Visit

## 2021-08-09 ENCOUNTER — Other Ambulatory Visit: Payer: Self-pay

## 2021-08-09 NOTE — PT Treatment (Signed)
[facility]  Outpatient Physical Therapy  [place], [address]  [phone]  [phone]    Physical Therapy Treatment Note    Date: 08/09/2021  Patient's Name: Billy Aguirre  Date of Birth: 1960/11/01    Visit # 6 of up to 21 planned  Authorization: Medical necessity   Plan of Care Signed?: No  Plan of Care Ends: 09/06/21  Next Progress Note Due: 08/23/21    Evaluating Physical Therapist: Joellen Jersey  PT Diagnosis: s/p left TKA  Next Scheduled (Referring) Physician Appointment: 08/31/21  Allergies/Contraindications: N/A    Time In: [id number] Time Out: 1525    Subjective: Patient rates pain today 3/10.  He states that his foot feels ice cold and that his knee has felt stiff since his last appointment.  He does note that he went to [place] where he rode a cart.  He also walked around the flea market on Saturday.      Objective:   Progressed exercises/therapy: Progressed treatment per below  Cuing required: min A Verbal, visual, and tactile cues for completion of exercises with correct form  Objective measure(s):   Left knee AROM 15-92 degrees at beginning of session   Left knee AROM at end of session: 10-98 degrees  Exercise/Activity Type Repetitions Resistance Last Performed   Nu-Step   5' L4 5/15   DKTC with Swiss ball    20 reps  5/15   Swiss ball bridges (ball under calves)   2x10  5/3   LAQ   2 x 10 3# 5/15   Hamstring curls (seated)   2x10 Red TB 5/15   Gentle PROM   Flexion and extension  5/15    SKC with ball  15  assist and slight overpressure 5/12   Hooklying hamstring stretch left 5 x 15 strap 5/12   Knee extension stretch at stairs 5x10"  5/15   CP 10'   no   Calf stretch on large incline board 1x60"  5/15   HR 1x15 on level surface with bilateral UE support  5/15         Patient education: Continue HEP    Assessment: Patient tolerated treatment well without reports of adverse effects.  He remains quite restricted with his extension AROM.  His  flexion AROM is progressing steadily.  He declined modalities after treatment.    Plan: Continue with emphasis on ROM    Goals:    STGs: 3-4 Weeks  1.  Patient will demonstrate improved left knee AROM to at least 0 to 120 degrees to aid in ability to ambulate up and down stairs.  2.  Patient will demonstrate independence with progressive HEP to maximize gains from physical therapy.  3.  Patient will report max 6/10 pain to aid in completion of ADLs/work duties.    LTGs: 6-8 Weeks  1.  Patient will demonstrate improved left knee strength to at least 5/5 aid in functional transfers and return to full work duties.  2.  Patient will demonstrate improved TUG time of maximum 13.5 seconds with least restrictive assistive device to demonstrate decreased falls risk.  3.  Patient will demonstrate 10 squats/sit <> stands with 5# and good form to aid in completion of ADLs.  4.  Patient will demonstrate improved functional ability with Patient Specific Scale Score of at least 2.  5.  Patient will demonstrate ability to ambulate up and down stairs with reciprocal pattern and without use of UEs to  aid in community ambulation.    Total Session Time 37 and Timed code minutes 37  THERAPEUTIC EXERCISE 37 minutes    Orion Crook, PT  08/09/2021, 15:27

## 2021-08-11 ENCOUNTER — Ambulatory Visit (HOSPITAL_COMMUNITY)
Admission: RE | Admit: 2021-08-11 | Discharge: 2021-08-11 | Disposition: A | Discharge status: Still In Hospital | Payer: 59 | Source: Ambulatory Visit

## 2021-08-11 ENCOUNTER — Other Ambulatory Visit: Payer: Self-pay

## 2021-08-11 NOTE — PT Treatment (Signed)
[facility]  Outpatient Physical Therapy  [address]  [place], [address]  [phone]  (Fax) [phone]    Physical Therapy Treatment Note    Date: 08/11/2021  Patient's Name: Billy Aguirre  Date of Birth: 1961-02-19            Visit #/POC: 7/ 21  Authorization:[employer]      Evaluating Physical Therapist: Bridgett Larsson DPT  PT diagnosis/Reason for Referral: L TKA  Next Scheduled Physician Appointment: 09/06/21          Subjective: Pt reports doing ok.  Notes still cannot get his knee straight.  States he is doing exercise at home as advised.  Rates pain 6/10 at worst over past week.  States that is usually a sharp shooting pain associated with movement.  Least pain 1/10.      Objective: Warm up on Nustep followed by activities noted below.  Pt declined CP at end of session.  AAROM in supine 10-103      Exercise/Activity Type Repetitions Resistance  Performed this day   Nu-Step   5' L4  yes   DKTC with Swiss ball    20 reps   no   Swiss ball bridges (ball under calves)   2x10  no   LAQ   2 x 10 3#  no   Hamstring curls (seated)   2x10 Red TB  no   Gentle PROM   Flexion and extension   yes   SKC with ball 15 assist and slight overpressure  no   Hooklying hamstring stretch left 5 x 15 strap  no   Knee extension stretch at stairs 5x10"   yes   CP 10'  no   Calf stretch on large incline board 1x60"   yes   HR 1x15 on level surface with bilateral UE support   no    standing TKE with ball vs door  5 sec x 15  na  yes               single leg press 2x10 60# yes           Assessment: Pt tolerated treatment well.  Painful with end range stretching.  Does continue to have significant limitation with extension. Pt having some back pain today so extension stretching was more uncomfortable.     Plan: Will continue and progress as tolerated    Goals:    STGs:3-4Weeks  1. Patient will demonstrate improved leftknee AROM to at least 0to 120degrees to aid in  ability to ambulate up and down stairs.  2. Patient will demonstrate independence with progressive HEP to maximize gains from physical therapy.  3. Patient will report max 6/10 pain to aid in completion of ADLs/work duties.    LTGs:6-8Weeks  1. Patient will demonstrate improved leftknee strength to at least 5/5aid in functional transfers and return to full work duties.  2. Patient will demonstrate improved TUG time of maximum 13.5seconds with least restrictive assistive device to demonstrate decreased falls risk.  3. Patient will demonstrate 10 squats/sit <>stands with 5# and good form to aid in completion of ADLs.  4. Patient will demonstrate improved functional ability with Patient Specific Scale Score of at least 2.  5. Patient will demonstrate ability to ambulate up and down stairs with reciprocalpattern and withoutuse of UEs to aid in community ambulation.        Total Session Time 40 and Timed code minutes 40  THERAPEUTIC  EXERCISE 40 minutes      Miguelangel Korn, PTA  08/11/2021, 10:21

## 2021-08-13 ENCOUNTER — Ambulatory Visit (HOSPITAL_COMMUNITY)
Admission: RE | Admit: 2021-08-13 | Discharge: 2021-08-13 | Disposition: A | Discharge status: Still In Hospital | Payer: 59 | Source: Ambulatory Visit

## 2021-08-13 ENCOUNTER — Other Ambulatory Visit: Payer: Self-pay

## 2021-08-13 NOTE — PT Treatment (Addendum)
[facility]  Outpatient Physical Therapy  [address]  [place], [address]  [phone]  (Fax) [phone]    Physical Therapy Treatment Note    Date: 08/13/2021  Patient's Name: Billy Aguirre  Date of Birth: 1960/06/19            Visit #/POC: 8/21  Authorization:[employer]      Evaluating Physical Therapist: Bridgett Larsson DPT  PT diagnosis/Reason for Referral: L TKA  Next Scheduled Physician Appointment: 09/06/21          Subjective: Pt states he was very sore and stiff after last treatment.  Notes swelling increased and he had difficulty sleeping.  Is almost back to normal today.  However notes does feel like it is moving some better on Nustep Warm up.    Objective: Warmup on Nustep followed by activities noted below    Exercise/Activity Type Repetitions Resistance  Performed this day   Nu-Step   5' L4  yes   DKTC with Swiss ball    20 reps   no   Swiss ball bridges (ball under calves)   2x10  no   LAQ   2x 10 3#  no   Hamstring curls (seated)   2x10 RedTB  no   Gentle PROM   Flexion and extension   yes   SKC with ball 15 assist and slight overpressure  no   Hooklying hamstring stretch left 5 x 15 strap  no   Knee extension stretch at stairs 5x10"   yes   CP 10'  no   Calf stretch on large incline board 1x60"   yes   HR 1x15 on level surface with bilateral UE support   no    standing TKE with ball vs door  5 sec x 15  na  no               single leg press 2x10 60# yes               Assessment: Pt tolerated well today.  Still tender from last visit.  However no loss of mobility noted this visit.      Plan: Will continue and progress as tolerated    Goals:   STGs:3-4Weeks  1. Patient will demonstrate improved leftknee AROM to at least 0to 120degrees to aid in ability to ambulate up and down stairs.  2. Patient will demonstrate independence with progressive HEP to maximize gains from physical therapy.  3. Patient will report max 6/10  pain to aid in completion of ADLs/work duties.    LTGs:6-8Weeks  1. Patient will demonstrate improved leftknee strength to at least 5/5aid in functional transfers and return to full work duties.  2. Patient will demonstrate improved TUG time of maximum 13.5seconds with least restrictive assistive device to demonstrate decreased falls risk.  3. Patient will demonstrate 10 squats/sit <>stands with 5# and good form to aid in completion of ADLs.  4. Patient will demonstrate improved functional ability with Patient Specific Scale Score of at least 2.  5. Patient will demonstrate ability to ambulate up and down stairs with reciprocalpattern and withoutuse of UEs to aid in community ambulation.        Total Session Time 35 and Timed code minutes 35  THERAPEUTIC EXERCISE 35 minutes      Jaymie Mckiddy, PTA  08/13/2021, 11:11

## 2021-08-16 ENCOUNTER — Other Ambulatory Visit: Payer: Self-pay

## 2021-08-16 ENCOUNTER — Ambulatory Visit (HOSPITAL_COMMUNITY)
Admission: RE | Admit: 2021-08-16 | Discharge: 2021-08-16 | Disposition: A | Discharge status: Still In Hospital | Payer: 59 | Source: Ambulatory Visit

## 2021-08-16 NOTE — PT Treatment (Signed)
[facility]  Outpatient Physical Therapy  [address]  [place], [address]  [phone]  (Fax) [phone]    Physical Therapy Treatment Note    Date: 08/16/2021  Patient's Name: Billy Aguirre  Date of Birth: 1960-11-16            Visit #/POC: 9/21  Authorization:[employer]      Evaluating Physical Therapist: Bridgett Larsson DPT  PT diagnosis/Reason for Referral: L TKA   Next Scheduled Physician Appointment:  09/06/21          Subjective: Pt reports doing well. States had a good weekend.  Notes yesterday he had minimal swelling.  Is having some foot pain and "coldness " in his L foot.  States he is doing exercises at home as advised.     Objective: Warm up on Nustep followed by activities noted below    Exercise/Activity Type Repetitions Resistance Performed this day   Nu-Step   5' L4 yes   DKTC with Swiss ball    20 reps  no   Swiss ball bridges (ball under calves)   2x10  no   LAQ   2x 10 3# no   Hamstring curls (seated)   2x10 RedTB no   Gentle PROM   Flexion and extension  yes   SKC with ball 15 assist and slight overpressure no   Hooklying hamstring stretch left 5 x 15 strap no   Knee extension stretch at stairs 5x10"  yes   CP 10'  no   Calf stretch on large incline board 1x60"  yes   HR 1x15 on level surface with bilateral UE support   yes   standing TKE with ball vs door 5 sec x 15 na  yes               single leg press 2x10 60# yes             Assessment: Pt tolerated treatment well.  Continues to have significantly limited extension.  Flexion slowly improving.  Pt walking with improved pattern.      Plan: Will continue and progress as tolerated    Goals:   STGs:3-4Weeks  1. Patient will demonstrate improved leftknee AROM to at least 0to 120degrees to aid in ability to ambulate up and down stairs.  2. Patient will demonstrate independence with progressive HEP to maximize gains from physical therapy.  3. Patient will  report max 6/10 pain to aid in completion of ADLs/work duties.    LTGs:6-8Weeks  1. Patient will demonstrate improved leftknee strength to at least 5/5aid in functional transfers and return to full work duties.  2. Patient will demonstrate improved TUG time of maximum 13.5seconds with least restrictive assistive device to demonstrate decreased falls risk.  3. Patient will demonstrate 10 squats/sit <>stands with 5# and good form to aid in completion of ADLs.  4. Patient will demonstrate improved functional ability with Patient Specific Scale Score of at least 2.  5. Patient will demonstrate ability to ambulate up and down stairs with reciprocalpattern and withoutuse of UEs to aid in community ambulation.          Total Session Time 37 and Timed code minutes 37  THERAPEUTIC EXERCISE 37 minutes      Gerrald Basu, PTA  08/16/2021, 10:28

## 2021-08-17 ENCOUNTER — Other Ambulatory Visit (HOSPITAL_COMMUNITY): Payer: Self-pay | Admitting: NURSE PRACTITIONER

## 2021-08-17 DIAGNOSIS — R079 Chest pain, unspecified: Secondary | ICD-10-CM

## 2021-08-18 ENCOUNTER — Other Ambulatory Visit: Payer: Self-pay

## 2021-08-18 ENCOUNTER — Ambulatory Visit (HOSPITAL_COMMUNITY)
Admission: RE | Admit: 2021-08-18 | Discharge: 2021-08-18 | Disposition: A | Discharge status: Still In Hospital | Payer: 59 | Source: Ambulatory Visit

## 2021-08-18 NOTE — PT Treatment (Signed)
[facility]  Outpatient Physical Therapy  [place], [address]  [phone]  [phone]    Physical Therapy Treatment Note    Date: 08/18/2021  Patient's Name: Billy Aguirre  Date of Birth: 12/05/1960    Visit # 55 of up to 21 planned  Authorization: Medical necessity   Plan of Care Signed?: No  Plan of Care Ends: 09/06/21  Next Progress Note Due: 08/23/21    Evaluating Physical Therapist: Joellen Jersey  PT Diagnosis: s/p left TKA  Next Scheduled (Referring) Physician Appointment: 08/31/21  Allergies/Contraindications: N/A    Time In: 4076 Time Out: 1100    Subjective: Patient rates his left knee pain 1/10 upon presentation.  He reports max pain 9/10 briefly at last PT session.    Objective:   Progressed exercises/therapy: Progressed treatment per below  Cuing required: min A Verbal, visual, and tactile cues for completion of exercises with correct form  Objective measure(s):   Left knee AROM 8-110 degrees at beginning of session   Left knee MMT: 4/5 extension, 4+/5 flexion  TUG: 7.67 seconds without AD    Patient-Specific Functional Score:  Problem Score   1. Be able to stand 4 hours 2   2. 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 5   Total: 3.5    10x sit <> stand: without weight, with left foot slightly anterior to right foot    Stair climbing: without UEs, climbs and descends reciprocally 2x on larger clinic steps    Exercise/Activity Type Repetitions Resistance Last Performed   Nu-Step   5' L5 5/26   DKTC with Swiss ball    20 reps  no   Swiss ball bridges (ball under calves)   2x10  no   LAQ   2x 10 5# 5/24   Hamstring curls (seated)   2x10 GreenTB 5/24   Gentle PROM   Flexion and extension  5/22   SKC with ball 15 assist and slight overpressure no   Hooklying hamstring stretch left 5 x 15 strap no   Knee extension stretch at stairs 5x10"  5/22   CP 10'  no   Calf stretch on large incline board 1x60"  5/22   HR 1x15 on level surface with  bilateral UE support  5/22   standing TKE with ball vs door 5 sec x 15 na 5/22   Seated hip abduction machine 2x10 35# 5/24         single leg press 2x10 60# with VCs for knee extension 5/24     Patient education: Updated HEP as follows with green TB provided:  Exercises  - Prone Knee Extension Hang  - 2 x daily - 7 x weekly - 1 sets - 1 reps - 10 minutes hold  - Forward Step Up  - 1 x daily - 7 x weekly - 2 sets - 10 reps  - Side Stepping with Resistance at Thighs  - 1 x daily - 7 x weekly - 2 sets - 10 reps  - Seated Knee Flexion Extension AROM   - 3 x daily - 7 x weekly - 2 sets - 10 reps  - Supine Heel Slide  - 3 x daily - 7 x weekly - 2 sets - 10 reps  - Standing Knee Flexion Stretch on Step  - 2 x daily - 7 x weekly - 2 sets - 10 reps    Assessment: Patient tolerated treatment well without reports  of adverse effects.  He continues to lack full extension and has made no progress since his initial evaluation.  He has made significant progress with his flexion AROM, strength, and functional ability however.  He demonstrated and verbalized understanding of HEP and patient education.  Please see below for most up to date progress towards his goals.    Plan: Continue with emphasis on ROM    Goals:    STGs: 3-4 Weeks  1.  Patient will demonstrate improved left knee AROM to at least 0 to 120 degrees to aid in ability to ambulate up and down stairs. - Progressed, not yet met 5/24  2.  Patient will demonstrate independence with progressive HEP to maximize gains from physical therapy.   3.  Patient will report max 6/10 pain to aid in completion of ADLs/work duties. - Not met 5/24    LTGs: 6-8 Weeks  1.  Patient will demonstrate improved left knee strength to at least 5/5 aid in functional transfers and return to full work duties. - Progressed, not yet met 5/24  2.  Patient will demonstrate improved TUG time of maximum 13.5 seconds with least restrictive assistive device to demonstrate decreased falls risk. -  Met 5/24  3.  Patient will demonstrate 10 squats/sit <> stands with 5# and good form to aid in completion of ADLs. - Progressed, not yet met 5/24  4.  Patient will demonstrate improved functional ability with Patient Specific Scale Score of at least 2. - Met 5/24  5.  Patient will demonstrate ability to ambulate up and down stairs with reciprocal pattern and without use of UEs to aid in community ambulation. - Met 5/24    Total Session Time 41 and Timed code minutes 41  THERAPEUTIC EXERCISE 41 minutes    Orion Crook, PT  08/18/2021, 10:21

## 2021-08-20 ENCOUNTER — Inpatient Hospital Stay (HOSPITAL_COMMUNITY)
Admission: RE | Admit: 2021-08-20 | Discharge: 2021-08-20 | Disposition: A | Discharge status: Home / Self Care | Payer: 59 | Source: Ambulatory Visit

## 2021-08-20 ENCOUNTER — Inpatient Hospital Stay
Admission: RE | Admit: 2021-08-20 | Discharge: 2021-08-20 | Disposition: A | Discharge status: Home / Self Care | Payer: 59 | Source: Ambulatory Visit | Attending: NURSE PRACTITIONER | Admitting: NURSE PRACTITIONER

## 2021-08-20 ENCOUNTER — Other Ambulatory Visit: Payer: Self-pay

## 2021-08-20 ENCOUNTER — Ambulatory Visit (HOSPITAL_COMMUNITY)
Admission: RE | Admit: 2021-08-20 | Discharge: 2021-08-20 | Disposition: A | Discharge status: Still In Hospital | Payer: 59 | Source: Ambulatory Visit

## 2021-08-20 ENCOUNTER — Other Ambulatory Visit (HOSPITAL_COMMUNITY): Payer: Self-pay | Admitting: NURSE PRACTITIONER

## 2021-08-20 DIAGNOSIS — R0602 Shortness of breath: Secondary | ICD-10-CM | POA: Insufficient documentation

## 2021-08-20 DIAGNOSIS — R079 Chest pain, unspecified: Secondary | ICD-10-CM | POA: Insufficient documentation

## 2021-08-20 LAB — ECG 12 LEAD
Atrial Rate: 73 {beats}/min
Calculated P Axis: 13 degrees
Calculated R Axis: 4 degrees
Calculated T Axis: -7 degrees
PR Interval: 180 ms
QRS Duration: 90 ms
QT Interval: 374 ms
QTC Calculation: 412 ms
Ventricular rate: 73 {beats}/min

## 2021-08-20 NOTE — PT Treatment (Addendum)
[facility]  Outpatient Physical Therapy  [place], [address]  [phone]  [phone]    Physical Therapy Treatment Note    Date: 08/20/2021  Patient's Name: Billy Aguirre  Date of Birth: 06-Nov-1960    Visit # 69 of up to 21 planned  Authorization: Medical necessity   Plan of Care Signed?: No  Plan of Care Ends: 09/06/21  Next Progress Note Due: 08/23/21    Evaluating Physical Therapist: Joellen Jersey  PT Diagnosis: s/p left TKA  Next Scheduled (Referring) Physician Appointment: 08/31/21  Allergies/Contraindications: N/A    Time In: 6144 Time Out: 10:55    Subjective: Patient rates left knee pain upon arrival 2/10. He used a Chiropractor yesterday and states knee "swelled up" and was a little more painful last night.    Objective: therex for knee strengthening followed by MFR to left hamstring and scar. MFR techniques to hamstring incorporating J stroke, vertical stroke, and cross hand deep release. Patient measured at end of session for active knee extension and achieved -8 degrees.       Exercise/Activity Type Repetitions Resistance Last Performed   Nu-Step   6' L5 5/26   DKTC with Swiss ball    20 reps  no   Swiss ball bridges (ball under calves)   2x10  no   LAQ   2x 10 5# 5/24   Hamstring curls (seated)   2x10 GreenTB 5/24   Gentle PROM   Flexion and extension  5/22   SKC with ball 15 assist and slight overpressure no   Hooklying hamstring stretch left 5 x 15 strap no   Knee extension stretch at stairs 5x10"  5/26   CP 10'  no   Calf stretch on large incline board 3x30  5/26   HR 1x15 on level surface with bilateral UE support  5/22   standing TKE with ball vs door 5 sec x 15 na 5/22   Seated hip abduction machine 2x15 45# 5/26         single leg press 2x10 60# with VCs for knee extension 5/26         Assessment: Patient remains most limited with extension. Left hamstring very tight with soft tissue restrictions. He reported  good relief with deep myofascial release techniques.     Plan: Continue with emphasis on ROM    Goals:    STGs: 3-4 Weeks  1.  Patient will demonstrate improved left knee AROM to at least 0 to 120 degrees to aid in ability to ambulate up and down stairs. - Progressed, not yet met 5/24  2.  Patient will demonstrate independence with progressive HEP to maximize gains from physical therapy.   3.  Patient will report max 6/10 pain to aid in completion of ADLs/work duties. - Not met 5/24    LTGs: 6-8 Weeks  1.  Patient will demonstrate improved left knee strength to at least 5/5 aid in functional transfers and return to full work duties. - Progressed, not yet met 5/24  2.  Patient will demonstrate improved TUG time of maximum 13.5 seconds with least restrictive assistive device to demonstrate decreased falls risk. - Met 5/24  3.  Patient will demonstrate 10 squats/sit <> stands with 5# and good form to aid in completion of ADLs. - Progressed, not yet met 5/24  4.  Patient will demonstrate improved functional ability with Patient Specific Scale Score of at least 2. - Met 5/24  5.  Patient will demonstrate ability to ambulate up and down stairs with reciprocal pattern and without use of UEs to aid in community ambulation. - Met 5/24    Total Session Time 39 and Timed code minutes 33  THERAPEUTIC EXERCISE 33 minutes    Danasha Melman, PTA

## 2021-08-24 ENCOUNTER — Ambulatory Visit
Admission: RE | Admit: 2021-08-24 | Discharge: 2021-08-24 | Disposition: A | Discharge status: Still In Hospital | Payer: 59 | Source: Ambulatory Visit

## 2021-08-24 ENCOUNTER — Other Ambulatory Visit: Payer: Self-pay

## 2021-08-24 NOTE — PT Treatment (Signed)
[facility]  Outpatient Physical Therapy  [place], [address]  [phone]  [phone]    Physical Therapy Treatment Note    Date: 08/24/2021  Patient's Name: Billy Aguirre  Date of Birth: 05/30/1960            Visit #/POC:  11/21; 09/06/21  Authorization:[employer]      Evaluating Physical Therapist: Rosalio Macadamia DPT   PT diagnosis/Reason for Referral: L TKA  Next Scheduled Physician Appointment: 08/31/21          Subjective:  States knee is doing well.  Notes still very stiff and did have some pain yesterday. Notes some Sciatic Nerve symptoms over past few weeks that has been irritating.  Notes sleeping well most nights.  Remains very active on farm.  States he is doing exercise at home as well.  Reports 60% improvement overall.      Objective: Warm up on Nustep followed by activities noted below    Exercise/Activity Type Repetitions Resistance  Completed this date   Nu-Step   6' L5  yes   DKTC with Swiss ball    20 reps  no   Swiss ball bridges (ball under calves)   2x10  no   LAQ   2x 10 5#  no   Hamstring curls (seated)   2x10 GreenTB  no   Gentle PROM   Flexion and extension   yes   SKC with ball 15 assist and slight overpressure no   Hooklying hamstring stretch left 5 x 15 strap no   Knee extension stretch at stairs 5x10"   yes   CP 10'  no   Calf stretch on large incline board 3x30   no   HR 1x15 on level surface with bilateral UE support   no   standing TKE with ball vs door 5 sec x 15 na  yes   Seated hip abduction machine 2x15 45#  yes   wall squat 15  yes   single leg press 2x10 60# with VCs for knee extension  yes               Assessment: Pt tolerated well.  Continues to be limited with extension.  Notes Sciatic Nerve symptoms contributing to his pain.  Overall improved function and pain.      Plan: Will continue and progress as tolerated.    Goals:    STGs:3-4Weeks  1. Patient will demonstrate improved  leftknee AROM to at least 0to 120degrees to aid in ability to ambulate up and down stairs. - Progressed, not yet met 5/24  2. Patient will demonstrate independence with progressive HEP to maximize gains from physical therapy.   3. Patient will report max 6/10 pain to aid in completion of ADLs/work duties. - Not met 5/24    LTGs:6-8Weeks  1. Patient will demonstrate improved leftknee strength to at least 5/5aid in functional transfers and return to full work duties. - Progressed, not yet met 5/24  2. Patient will demonstrate improved TUG time of maximum 13.5seconds with least restrictive assistive device to demonstrate decreased falls risk. - Met 5/24  3. Patient will demonstrate 10 squats/sit <>stands with 5# and good form to aid in completion of ADLs. - Progressed, not yet met 5/24  4. Patient will demonstrate improved functional ability with Patient Specific Scale Score of at least 2. - Met 5/24  5. Patient will demonstrate ability to ambulate up and down stairs with  reciprocalpattern and withoutuse of UEs to aid in community ambulation. - Met 5/24            Total Session Time 37 and Timed code minutes 37  THERAPEUTIC EXERCISE 37 minutes      Sheala Dosh, PTA  08/24/2021, 10:20

## 2021-08-25 ENCOUNTER — Inpatient Hospital Stay
Admission: RE | Admit: 2021-08-25 | Discharge: 2021-08-25 | Disposition: A | Discharge status: Home / Self Care | Payer: 59 | Source: Ambulatory Visit | Attending: NURSE PRACTITIONER | Admitting: NURSE PRACTITIONER

## 2021-08-25 ENCOUNTER — Other Ambulatory Visit: Payer: Self-pay

## 2021-08-25 DIAGNOSIS — R079 Chest pain, unspecified: Secondary | ICD-10-CM | POA: Insufficient documentation

## 2021-08-26 ENCOUNTER — Other Ambulatory Visit: Payer: Self-pay

## 2021-08-26 ENCOUNTER — Ambulatory Visit (HOSPITAL_COMMUNITY)
Admission: RE | Admit: 2021-08-26 | Discharge: 2021-08-26 | Disposition: A | Discharge status: Still In Hospital | Payer: 59 | Source: Ambulatory Visit

## 2021-08-26 DIAGNOSIS — Z96652 Presence of left artificial knee joint: Secondary | ICD-10-CM | POA: Insufficient documentation

## 2021-08-26 NOTE — PT Treatment (Signed)
[facility]  Outpatient Physical Therapy  [place], [address]  [phone]  [phone]    Physical Therapy Treatment Note    Date: 08/26/2021  Patient's Name: Billy Aguirre  Date of Birth: 04/01/1960    Visit # 25 of up to 21 planned  Authorization: Medical necessity   Plan of Care Signed?: No  Plan of Care Ends: 09/06/21  Next Progress Note Due: By RTD    Evaluating Physical Therapist: Joellen Jersey  PT Diagnosis: s/p left TKA  Next Scheduled (Referring) Physician Appointment: 08/31/21  Allergies/Contraindications: N/A    Time In: 7622          Time Out: 1053    Subjective: Patient rates his left knee pain 4/10 upon presentation and is very stiff.  He states that his cardiac testing showed he has had a heart attack between his first knee surgery and yesterday.  He states he doesn't yet have any restrictions, but remains short of breath.  He notes up to 7/10 pain last night.    Objective:   Progressed exercises/therapy: Progressed treatment per below  Cuing required: min A Verbal, visual, and tactile cues for completion of exercises with correct form  Objective measure(s):   Left knee AROM 8-108 degrees  Left knee PROM: 5 - 120 degrees  Left knee AROM at end: 4 - 120 degrees  Exercise/Activity Type Repetitions Resistance Last Performed   Nu-Step   5' L5 6/1   DKTC with Swiss ball    20 reps  no   Swiss ball bridges (ball under calves)   2x10  no   LAQ   2x 10 7.5# 6/1   Hamstring curls (seated)   2x10 GreenTB 5/24   Gentle PROM   Flexion and extension  6/1   SKC with ball 15 assist and slight overpressure no   Hooklying hamstring stretch left 5 x 15 strap no   Knee extension stretch at stairs 5x10"  5/22   CP 10'  no   Calf stretch on large incline board 1x60"  5/22   HR 1x15 on level surface with bilateral UE support  5/22   standing TKE with ball vs door 5 sec x 15 na 5/22   Seated hip abduction machine 2x10 35# 5/24   Wall squat 15   5/30   Single leg press 1x10  1x5 90# 6/1     Patient education: Continue HEP    Assessment: Patient tolerated treatment well without reports of adverse effects.  He did appear to make AROM following strength exercises today.  Note that his strength is improving well with improved ability to push more weight on the leg press.      Plan: Continue with emphasis on ROM    Goals:    STGs:3-4Weeks  1. Patient will demonstrate improved leftknee AROM to at least 0to 120degrees to aid in ability to ambulate up and down stairs. - Progressed, not yet met 5/24  2. Patient will demonstrate independence with progressive HEP to maximize gains from physical therapy.  - Met 6/1  3. Patient will report max 6/10 pain to aid in completion of ADLs/work duties. - Not met 6/1    LTGs:6-8Weeks  1. Patient will demonstrate improved leftknee strength to at least 5/5aid in functional transfers and return to full work duties. - Progressed, not yet met 5/24  2. Patient will demonstrate improved TUG time of maximum 13.5seconds with least restrictive assistive device to demonstrate  decreased falls risk. - Met 5/24  3. Patient will demonstrate 10 squats/sit <>stands with 5# and good form to aid in completion of ADLs. - Progressed, not yet met 5/24  4. Patient will demonstrate improved functional ability with Patient Specific Scale Score of at least 2. - Met 5/24  5. Patient will demonstrate ability to ambulate up and down stairs with reciprocalpattern and withoutuse of UEs to aid in community ambulation. - Met 5/24    Total Session Time 34 and Timed code minutes 34  THERAPEUTIC EXERCISE 34 minutes    Orion Crook, PT  08/26/2021, 10:22

## 2021-08-30 ENCOUNTER — Other Ambulatory Visit: Payer: Self-pay

## 2021-08-30 ENCOUNTER — Ambulatory Visit
Admission: RE | Admit: 2021-08-30 | Discharge: 2021-08-30 | Disposition: A | Discharge status: Still In Hospital | Payer: 59 | Source: Ambulatory Visit

## 2021-08-30 NOTE — PT Treatment (Addendum)
[facility]  Outpatient Physical Therapy  [place], [address]  [phone]  [phone]    Discharge Summary    Patient attended 14 visits of outpatient PT from 07/26/21 - 08/30/21 s/p left TKA.  His last appointment was cancelled as he was released by his surgeon.  Treatment consisted of therapeutic exercise, neuromuscular re-education, and functional ability to address his deficits in left knee AROM, strength, and functional ability.  Please see below note for most up to date progress towards his goals.  Patient is discharged at this time as he was released by his surgeon.    Orion Crook, PT  10/07/2021, 16:39       Physical Therapy Treatment Note    Date: 08/30/2021  Patient's Name: Billy Aguirre  Date of Birth: 08/25/1960    Visit # 8 of up to 21 planned  Authorization: Medical necessity   Plan of Care Signed?: No  Plan of Care Ends: 09/06/21  Next Progress Note Due: By RTD    Evaluating Physical Therapist: Joellen Jersey  PT Diagnosis: s/p left TKA  Next Scheduled (Referring) Physician Appointment: 08/31/21  Allergies/Contraindications: N/A    Time In: 1101          Time Out: 1141    Subjective: Patient states that his left knee is feeling pretty good today.  He notes that his right knee has been stiff recently and he has had a lot of general body pain including continued flare up of his left sciatic nerve.  He states he about 60% improved since start of care.  His worst left knee pain in the past week is 6/10.    Objective:   Progressed exercises/therapy: Progressed treatment per below  Cuing required: min A Verbal, visual, and tactile cues for completion of exercises with correct form  Objective measure(s):   Left knee AROM 8-115 degrees  Left knee AROM at end: 5 - 118 degrees  Exercise/Activity Type Repetitions Resistance Last Performed   Nu-Step   5' L6 6/5   DKTC with Swiss ball    20 reps  no   Swiss ball bridges (ball under calves)   2x10  no    LAQ   2x 10 10# 6/5   Hamstring curls (seated)   2x10 BlueTB 6/5   Gentle PROM   Flexion and extension  6/5   SKC with ball 15 assist and slight overpressure no   Hooklying hamstring stretch left 5 x 15 strap no   Knee extension stretch at stairs 5x10"  5/22   CP 10'  no   Calf stretch on large incline board 1x60"  5/22   HR 1x15 on level surface with bilateral UE support  5/22   standing TKE with ball vs door 5 sec x 15 na 5/22   Seated hip abduction machine 2x10 35# 5/24   Wall squat 15  5/30   Seated hip abduction machine 2x10 65# 6/5   Step ups - Forward 2x10 7" without UEs 6/5   Step ups - Lateral 1x10 7" 6/5   Single leg press 2x10 100# 6/5   Patient education: Continue HEP    Assessment: Patient tolerated treatment well without reports of adverse effects.  He again started out with restricted ROM, that improved with exercise.  His strength is improving well, with increasing ability to tolerate load on machines today.    Plan: Continue with emphasis on ROM pending MD recommendation after appointment tomorrow  Goals:    STGs:3-4Weeks  1. Patient will demonstrate improved leftknee AROM to at least 0to 120degrees to aid in ability to ambulate up and down stairs. - Progressed, not yet met 5/24  2. Patient will demonstrate independence with progressive HEP to maximize gains from physical therapy.  - Met 6/1  3. Patient will report max 6/10 pain to aid in completion of ADLs/work duties. - Met 6/5    LTGs:6-8Weeks  1. Patient will demonstrate improved leftknee strength to at least 5/5aid in functional transfers and return to full work duties. - Progressed, not yet met 5/24  2. Patient will demonstrate improved TUG time of maximum 13.5seconds with least restrictive assistive device to demonstrate decreased falls risk. - Met 5/24  3. Patient will demonstrate 10 squats/sit <>stands with 5# and good form to aid in completion of ADLs. - Progressed, not yet met 5/24  4.  Patient will demonstrate improved functional ability with Patient Specific Scale Score of at least 2. - Met 5/24  5. Patient will demonstrate ability to ambulate up and down stairs with reciprocalpattern and withoutuse of UEs to aid in community ambulation. - Met 5/24    Total Session Time 40 and Timed code minutes 40  THERAPEUTIC EXERCISE 40 minutes    Orion Crook, PT  08/30/2021, [phone]

## 2021-09-02 ENCOUNTER — Other Ambulatory Visit: Payer: Self-pay

## 2021-09-02 ENCOUNTER — Encounter (HOSPITAL_COMMUNITY): Payer: Self-pay

## 2021-09-02 ENCOUNTER — Ambulatory Visit (HOSPITAL_COMMUNITY): Admission: RE | Admit: 2021-09-02 | Payer: 59 | Source: Ambulatory Visit

## 2021-09-06 ENCOUNTER — Encounter (HOSPITAL_COMMUNITY): Payer: Self-pay

## 2021-09-06 ENCOUNTER — Ambulatory Visit (HOSPITAL_COMMUNITY): Payer: Self-pay

## 2021-09-13 ENCOUNTER — Other Ambulatory Visit (HOSPITAL_COMMUNITY): Payer: Self-pay | Admitting: Internal Medicine

## 2021-09-13 DIAGNOSIS — R079 Chest pain, unspecified: Secondary | ICD-10-CM

## 2021-09-13 DIAGNOSIS — R0609 Other forms of dyspnea: Secondary | ICD-10-CM

## 2021-09-13 DIAGNOSIS — I252 Old myocardial infarction: Secondary | ICD-10-CM

## 2021-09-13 DIAGNOSIS — R9431 Abnormal electrocardiogram [ECG] [EKG]: Secondary | ICD-10-CM

## 2021-09-22 ENCOUNTER — Telehealth (HOSPITAL_COMMUNITY): Payer: Self-pay | Admitting: Internal Medicine

## 2021-09-23 ENCOUNTER — Inpatient Hospital Stay
Admission: RE | Admit: 2021-09-23 | Discharge: 2021-09-23 | Disposition: A | Discharge status: Home / Self Care | Payer: 59 | Source: Ambulatory Visit | Attending: Internal Medicine | Admitting: Internal Medicine

## 2021-09-23 ENCOUNTER — Other Ambulatory Visit: Payer: Self-pay

## 2021-09-23 ENCOUNTER — Inpatient Hospital Stay (HOSPITAL_COMMUNITY)
Admission: RE | Admit: 2021-09-23 | Discharge: 2021-09-23 | Disposition: A | Discharge status: Home / Self Care | Payer: 59 | Source: Ambulatory Visit | Attending: Internal Medicine | Admitting: Internal Medicine

## 2021-09-23 DIAGNOSIS — R079 Chest pain, unspecified: Secondary | ICD-10-CM | POA: Insufficient documentation

## 2021-09-23 DIAGNOSIS — I252 Old myocardial infarction: Secondary | ICD-10-CM | POA: Insufficient documentation

## 2021-09-23 DIAGNOSIS — R0609 Other forms of dyspnea: Secondary | ICD-10-CM | POA: Insufficient documentation

## 2021-09-23 DIAGNOSIS — R9431 Abnormal electrocardiogram [ECG] [EKG]: Secondary | ICD-10-CM | POA: Insufficient documentation

## 2021-09-23 MED ORDER — REGADENOSON 0.4 MG/5 ML INTRAVENOUS SYRINGE
0.4000 mg | INJECTION | INTRAVENOUS | Status: AC
  Administered 2021-09-23: 0.4 mg via INTRAVENOUS
  Filled 2021-09-23: qty 5

## 2021-09-24 ENCOUNTER — Inpatient Hospital Stay (HOSPITAL_COMMUNITY)
Admission: RE | Admit: 2021-09-24 | Discharge: 2021-09-24 | Disposition: A | Discharge status: Home / Self Care | Payer: 59 | Source: Ambulatory Visit

## 2021-09-24 ENCOUNTER — Inpatient Hospital Stay
Admission: RE | Admit: 2021-09-24 | Discharge: 2021-09-24 | Disposition: A | Discharge status: Home / Self Care | Payer: 59 | Source: Ambulatory Visit | Attending: Internal Medicine | Admitting: Internal Medicine

## 2021-11-15 ENCOUNTER — Inpatient Hospital Stay (HOSPITAL_COMMUNITY): Payer: Self-pay | Admitting: Cardiovascular Disease

## 2021-11-15 ENCOUNTER — Encounter (INDEPENDENT_AMBULATORY_CARE_PROVIDER_SITE_OTHER): Payer: 59

## 2021-11-15 DIAGNOSIS — I77810 Thoracic aortic ectasia: Secondary | ICD-10-CM

## 2021-11-15 DIAGNOSIS — I517 Cardiomegaly: Secondary | ICD-10-CM

## 2021-11-15 DIAGNOSIS — Q231 Congenital insufficiency of aortic valve: Secondary | ICD-10-CM

## 2021-11-18 DIAGNOSIS — I350 Nonrheumatic aortic (valve) stenosis: Secondary | ICD-10-CM

## 2021-11-18 DIAGNOSIS — I10 Essential (primary) hypertension: Secondary | ICD-10-CM

## 2021-11-18 DIAGNOSIS — I2510 Atherosclerotic heart disease of native coronary artery without angina pectoris: Secondary | ICD-10-CM

## 2021-11-18 DIAGNOSIS — D62 Acute posthemorrhagic anemia: Secondary | ICD-10-CM

## 2021-12-08 ENCOUNTER — Other Ambulatory Visit (HOSPITAL_COMMUNITY): Payer: Self-pay | Admitting: NURSE PRACTITIONER

## 2021-12-08 ENCOUNTER — Other Ambulatory Visit: Payer: Self-pay

## 2021-12-08 ENCOUNTER — Inpatient Hospital Stay
Admission: RE | Admit: 2021-12-08 | Discharge: 2021-12-08 | Disposition: A | Discharge status: Home / Self Care | Payer: 59 | Source: Ambulatory Visit | Attending: NURSE PRACTITIONER | Admitting: NURSE PRACTITIONER

## 2021-12-08 DIAGNOSIS — R059 Cough, unspecified: Secondary | ICD-10-CM

## 2021-12-09 DIAGNOSIS — R059 Cough, unspecified: Secondary | ICD-10-CM

## 2021-12-20 ENCOUNTER — Other Ambulatory Visit: Payer: Self-pay

## 2021-12-20 ENCOUNTER — Inpatient Hospital Stay
Admission: RE | Admit: 2021-12-20 | Discharge: 2021-12-20 | Disposition: A | Discharge status: Home / Self Care | Payer: 59 | Source: Ambulatory Visit | Attending: NURSE PRACTITIONER | Admitting: NURSE PRACTITIONER

## 2021-12-20 ENCOUNTER — Other Ambulatory Visit (HOSPITAL_COMMUNITY): Payer: Self-pay | Admitting: NURSE PRACTITIONER

## 2021-12-20 ENCOUNTER — Inpatient Hospital Stay (HOSPITAL_COMMUNITY)
Admission: RE | Admit: 2021-12-20 | Discharge: 2021-12-20 | Disposition: A | Discharge status: Home / Self Care | Payer: 59 | Source: Ambulatory Visit

## 2021-12-20 DIAGNOSIS — R2242 Localized swelling, mass and lump, left lower limb: Secondary | ICD-10-CM

## 2022-01-06 ENCOUNTER — Other Ambulatory Visit (HOSPITAL_COMMUNITY): Payer: Self-pay | Admitting: Internal Medicine

## 2022-09-06 ENCOUNTER — Other Ambulatory Visit: Payer: Self-pay

## 2022-09-06 ENCOUNTER — Other Ambulatory Visit (HOSPITAL_COMMUNITY): Payer: Self-pay | Admitting: FAMILY PRACTICE

## 2022-09-06 ENCOUNTER — Inpatient Hospital Stay
Admission: RE | Admit: 2022-09-06 | Discharge: 2022-09-06 | Disposition: A | Discharge status: Home / Self Care | Payer: BC Managed Care – PPO | Source: Ambulatory Visit | Attending: FAMILY PRACTICE | Admitting: FAMILY PRACTICE

## 2022-09-06 DIAGNOSIS — M7989 Other specified soft tissue disorders: Secondary | ICD-10-CM

## 2022-11-21 ENCOUNTER — Encounter (INDEPENDENT_AMBULATORY_CARE_PROVIDER_SITE_OTHER): Payer: Self-pay | Admitting: Surgery

## 2022-11-29 ENCOUNTER — Other Ambulatory Visit: Payer: Self-pay

## 2022-11-29 ENCOUNTER — Ambulatory Visit (INDEPENDENT_AMBULATORY_CARE_PROVIDER_SITE_OTHER): Payer: BC Managed Care – PPO | Admitting: Surgery

## 2022-11-29 ENCOUNTER — Encounter (INDEPENDENT_AMBULATORY_CARE_PROVIDER_SITE_OTHER): Payer: Self-pay | Admitting: Surgery

## 2022-11-29 VITALS — BP 147/91 | HR 75 | Temp 96.6°F | Resp 18 | Ht 70.0 in | Wt 227.0 lb

## 2022-11-29 DIAGNOSIS — K439 Ventral hernia without obstruction or gangrene: Secondary | ICD-10-CM

## 2022-11-29 NOTE — Progress Notes (Signed)
GENERAL SURGERY, [facility]  [address]  [place] [address]    History and Physical    Name: Billy Aguirre MRN:  [id number]   Date: 11/29/2022 DOB:  1961-02-24 (62 y.o.)              Reason for Visit: Hernia    History of Present Illness  Billy Aguirre presents today for evaluation of the hernia associated with the previous median sternotomy incision.  The patient noticed this about 3 weeks ago.      Review of the result(s) of each unique test:  Patient underwent diagnostic testing ( none ) prior to this dates visit.  I have personally reviewed the results and that serves as a component of the medical decision making for this encounter       Review of prior external note(s) from each unique source:  Patients referral to this office including a recent assessment by the referring provider.  This was reviewed by me for this unique office visit for the indication and intent of the referral as well as any pertinent medical or surgical history relevant to the patients independent evaluation by me today.      Patient History  Past Medical History:   Diagnosis Date    Esophageal reflux     Hypertension          Past Surgical History:   Procedure Laterality Date    EYE SURGERY      FOOT SURGERY Bilateral     HX AORTIC VALVE REPLACEMENT      KNEE SURGERY Bilateral          Current Outpatient Medications   Medication Sig    aspirin (ECOTRIN) 81 mg Oral Tablet, Delayed Release (E.C.) Take 1 Tablet (81 mg total) by mouth Once a day    Diclofenac-Misoprostol 50-200 mg-mcg Oral Tab,IR & Delay Rel,Multiphasic Take 1 Tablet by mouth Twice daily    dilTIAZem (CARDIZEM CD) 120 mg Oral Capsule, Sust. Release 24 hr     loratadine (CLARITIN) 10 mg Oral Tablet Take 1 Tablet (10 mg total) by mouth Once per day as needed    metoprolol succinate (TOPROL-XL) 25 mg Oral Tablet Sustained Release 24 hr 0.5 Tablets (12.5 mg total)    omeprazole (PRILOSEC) 20 mg Oral Capsule, Delayed Release(E.C.) Take 1  Capsule (20 mg total) by mouth Once a day    pantoprazole (PROTONIX) 40 mg Oral Tablet, Delayed Release (E.C.) Take 1 Tablet (40 mg total) by mouth Once a day    PLAVIX 75 mg Oral Tablet Take 1 Tablet (75 mg total) by mouth     No Known Allergies  Family Medical History:    None         Social History     Tobacco Use    Smoking status: Never    Smokeless tobacco: Never   Vaping Use    Vaping status: Never Used   Substance Use Topics    Alcohol use: Not Currently    Drug use: Not Currently            Physical Examination:  Vitals:    11/29/22 1540   BP: (!) 147/91   Pulse: 75   Resp: 18   Temp: (!) 35.9 C (96.6 F)   SpO2: 98%   Weight: 103 kg (227 lb)   Height: 1.778 m (5\' 10" )   BMI: 32.64        General: appropriate for age. in no acute  distress.    Vital signs are present above and have been reviewed by me     HEENT: Atraumatic, Normocephalic. PERRLA. EOMI. Nose clear. Throat clear    Lungs: Nonlabored breathing with symmetric expansion. Clear to auscultation bilaterally    Heart:Regular wth respect to rate and rythmn.    Chest:  Close examination of the area of questionable hernia shows that it was actually in the distal aspect of his median sternotomy incision along the xiphoid region.  I can feel a defect just below the distal portion of the sternum however I can also feel bony structures distal to this area.  It seems to be associated with his previous sternotomy incision and it was not actually on the abdominal wall.      Abdomen:Soft. Nontender. Nondistended and benign otherwise    Extremities: Grossly normal. No major deformities     Neuro:  Grossly normal motor and sensory function    Psychiatric: Alert and oriented to person, place, and time. affect appropriate      Assessment and Plan  Hernia associated with median sternotomy  I recommended that the patient be referred back to his thoracic surgeon who did the open heart surgery because this is an unusual hernia located along the distal aspect of the  sternum although I can feel bony structures distal to the area in question as well.  I told the patient that it would be best evaluated by his surgeon because this is an unusual location to develop a hernia.  These were typically also quite difficult to repair because of the surrounding bony structures preventing any easy closure a defect such as this.  If repaired, this may even require a mesh prosthesis.      Follow Up:  No follow-ups on file.      ICD-10-CM    1. 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  If you have any questions or concerns regarding this encounter, please do not hesitate to contact me at your convenience.      This note may have been partially generated using MModal Fluency Direct system, and there may be some incorrect words, spellings, and punctuation that were not noted in checking the note before saving, though effort was made to avoid such errors.

## 2022-12-30 ENCOUNTER — Telehealth (INDEPENDENT_AMBULATORY_CARE_PROVIDER_SITE_OTHER): Payer: Self-pay

## 2022-12-30 NOTE — Telephone Encounter (Signed)
Spoke with patient after receiving a referral. I scheduled the appointment on 01/10/2023 @1430 . Patient confirmed appointment and stated agreement with this date and time. I gave patient our number if he had any questions or concerns.     Grace Bushy, CMA

## 2023-01-06 ENCOUNTER — Telehealth (INDEPENDENT_AMBULATORY_CARE_PROVIDER_SITE_OTHER): Payer: Self-pay | Admitting: THORACIC SURGERY CARDIOTHORACIC VASCULAR SURGERY

## 2023-01-06 NOTE — Telephone Encounter (Signed)
Called patient to remind of upcoming appointment in cardiac surgery office. Patient is aware of and intends to keep the appointment.       Gabriela Eves, CMA

## 2023-01-10 ENCOUNTER — Encounter (INDEPENDENT_AMBULATORY_CARE_PROVIDER_SITE_OTHER): Payer: Self-pay | Admitting: THORACIC SURGERY CARDIOTHORACIC VASCULAR SURGERY

## 2023-01-10 ENCOUNTER — Ambulatory Visit (INDEPENDENT_AMBULATORY_CARE_PROVIDER_SITE_OTHER): Payer: BC Managed Care – PPO | Admitting: THORACIC SURGERY CARDIOTHORACIC VASCULAR SURGERY

## 2023-01-10 ENCOUNTER — Other Ambulatory Visit: Payer: Self-pay

## 2023-01-10 VITALS — BP 135/80 | HR 68 | Ht 70.0 in | Wt 233.2 lb

## 2023-01-10 DIAGNOSIS — I10 Essential (primary) hypertension: Secondary | ICD-10-CM

## 2023-01-10 DIAGNOSIS — I2510 Atherosclerotic heart disease of native coronary artery without angina pectoris: Secondary | ICD-10-CM

## 2023-01-10 DIAGNOSIS — Z951 Presence of aortocoronary bypass graft: Secondary | ICD-10-CM

## 2023-01-10 DIAGNOSIS — K219 Gastro-esophageal reflux disease without esophagitis: Secondary | ICD-10-CM

## 2023-01-10 DIAGNOSIS — K432 Incisional hernia without obstruction or gangrene: Secondary | ICD-10-CM

## 2023-01-12 DIAGNOSIS — K219 Gastro-esophageal reflux disease without esophagitis: Secondary | ICD-10-CM | POA: Insufficient documentation

## 2023-01-12 DIAGNOSIS — I2510 Atherosclerotic heart disease of native coronary artery without angina pectoris: Secondary | ICD-10-CM | POA: Insufficient documentation

## 2023-01-12 DIAGNOSIS — Z951 Presence of aortocoronary bypass graft: Secondary | ICD-10-CM | POA: Insufficient documentation

## 2023-01-12 DIAGNOSIS — I10 Essential (primary) hypertension: Secondary | ICD-10-CM | POA: Insufficient documentation

## 2023-01-12 NOTE — H&P (Signed)
Cardiac Surgery [facility], Medical Office Building B  [address]  [place] [address]  [phone]    Cardiothoracic Surgery  Clinic Note    Name: Billy Aguirre   DOB: 02-02-1961  [62 y.o. male]   MRN: [id number]       Visit Date: 01/10/2023   Referring: Stephenie Acres, MD  [address]  [place],  [place] [address]   PCP: Stephenie Acres, MD         Chief Complaint: New Patient (Hernia at the end of the open heart incision)      History of Present Illness   Billy Aguirre is a 62 y.o. White male who presents with incisional hernia. Patient has HX of CABG and root repair with Dr. Aleen Campi in 10/2021 at [facility]. He tolerated well and has been doing well since that time. He reports recent yard work and noticed a bulge at base of sternal incision. He denies pain, SOB, or bowel problems. He is concerned it might be something that needed addressed so he wanted Dr. Aleen Campi to evaluate. His sternum is stable with well healed sternal incision. He reports some palpitations at night when lying flat.     Patient Active Problem List    Diagnosis Date Noted    Coronary artery disease involving native coronary artery of native heart without angina pectoris 01/12/2023    S/P CABG (coronary artery bypass graft) 01/12/2023    Gastroesophageal reflux disease without esophagitis 01/12/2023    Primary hypertension 01/12/2023       Allergies  No Known Allergies    Medications    Current Outpatient Medications:     aspirin (ECOTRIN) 81 mg Oral Tablet, Delayed Release (E.C.), Take 1 Tablet (81 mg total) by mouth Once a day, Disp: , Rfl:     atorvastatin (LIPITOR) 40 mg Oral Tablet, Take 1 Tablet (40 mg total) by mouth, Disp: , Rfl:     Diclofenac-Misoprostol 50-200 mg-mcg Oral Tab,IR & Delay Rel,Multiphasic, Take 1 Tablet by mouth Twice daily, Disp: , Rfl:     dilTIAZem (CARDIZEM CD) 120 mg Oral Capsule, Sust. Release 24 hr, , Disp: , Rfl:     hydroCHLOROthiazide (MICROZIDE) 12.5 mg Oral Capsule, Take 1 Capsule  (12.5 mg total) by mouth Once a day, Disp: , Rfl:     loratadine (CLARITIN) 10 mg Oral Tablet, Take 1 Tablet (10 mg total) by mouth Once per day as needed, Disp: , Rfl:     metoprolol succinate (TOPROL-XL) 25 mg Oral Tablet Sustained Release 24 hr, 0.5 Tablets (12.5 mg total), Disp: , Rfl:     omeprazole (PRILOSEC) 20 mg Oral Capsule, Delayed Release(E.C.), Take 1 Capsule (20 mg total) by mouth Once a day (Patient not taking: Reported on 01/10/2023), Disp: , Rfl:     pantoprazole (PROTONIX) 40 mg Oral Tablet, Delayed Release (E.C.), Take 1 Tablet (40 mg total) by mouth Once a day, Disp: , Rfl:     PLAVIX 75 mg Oral Tablet, Take 1 Tablet (75 mg total) by mouth, Disp: , Rfl:     History  Past Medical History:   Diagnosis Date    Esophageal reflux     Hypertension          Past Surgical History:   Procedure Laterality Date    EYE SURGERY      FOOT SURGERY Bilateral     HX AORTIC VALVE REPLACEMENT      KNEE SURGERY Bilateral      Social History  Socioeconomic History    Marital status: Married   Tobacco Use    Smoking status: Never    Smokeless tobacco: Never   Vaping Use    Vaping status: Never Used   Substance and Sexual Activity    Alcohol use: Not Currently    Drug use: Not Currently     Family Medical History:    None           Review of Systems:  General: Denies fever or chills. No fatigue.   CNS: Denies syncope or dizziness. No headache.  Eyes: Denies recent visual changes. No pain.  Ears, Nose, and Throat: Denies ear pain, nasal congestion, or sore throat.  Endocrine: Denies excessive hunger or thirst. No heat/cold intolerance.  Respiratory: Denies shortness of breath. No cough.  Cardiovascular: Per HPI Denies claudication or varicose veins. + palpitations  GI: Denies nausea/vomiting. No black/bloody bowel movements.  GU: Denies hematuria or dysuria. No incontinence.  Musculoskeletal: Denies joint swelling or decreased ROM.  Skin: No rashes, hives, or eczema.  Psychiatric: Denies anxiety, depression, or  insomnia.  Hematologic: No bleedings disorders or swollen nodes.    All other systems reviewed and either negative or not pertinent.     Physical Examination:  BP 135/80 (Site: Non-Invasive BP, Patient Position: Sitting, Cuff Size: Adult) Comment: left arm  Pulse 68   Ht 1.778 m (5\' 10" )   Wt 106 kg (233 lb 3.2 oz)   SpO2 95%   BMI 33.46 kg/m       General: Awake. Well nourished. No acute distress. A&O x 3.  HEENT: Normocephalic, atraumatic. PERRL. Normal hearing.  Neck: No JVD or bruit. Trachea midline.  Lungs: Clear to auscultation. No rhonchi or wheezing noted. Unlabored.  Heart: Regular rate and rhythm. Sternal incision with small bulging at base of sternum with cough  Abdomen: Soft, non-tender, non-distended.  Extremities: No clubbing, cyanosis, or edema.  Psychiatric: Cooperative, appropriate mood and affect.  Musculoskeletal: Strength grips are equal. MAE.  Skin: Warm, dry, and intact. No rashes or hives are noted.       No orders of the defined types were placed in this encounter.      There are no discontinued medications.    Assessment and Plan:  Assessment/Plan   1. Incisional hernia, without obstruction or gangrene    2. Coronary artery disease involving native coronary artery of native heart without angina pectoris    3. S/P CABG (coronary artery bypass graft)    4. Gastroesophageal reflux disease without esophagitis    5. Primary hypertension        PLAN: 62 year old with small sternal incisional hernia. Patient was evaluated per Dr. Aleen Campi. No surgical indication at this time. He was instructed to call our office if bulge is not reducible or begins to cause pain. He verbalized understanding. RTO here as needed in the future.       Follow up:  No follow-ups on file.      The patient was given the opportunity to ask questions and those questions were answered to the patient's satisfaction. The patient was encouraged to call with any additional questions or concerns. Discussed with the effects and  side effects of medications. Medication safety was discussed. The patient was informed to contact the office within 7 business days if a message/lab results/referral/imaging results have not been conveyed to the patient.     Karma Ganja, FNP-BC  [facility]  Cardiothoracic Surgery   [facility]  A portion of this documentation may have been generated using MMODAL voice recognition software and may contain syntax/voice recognition errors.        Agree with above note.  Patient was personally seen and evaluated.  Patient may have a very small incisional hernia at the lower aspect of his prior sternotomy.  I discussed with the patient that there was no evidence of incarceration or strangulation.  Therefore in this this area significantly bothers the patient I do not think we need to offer surgical intervention.  I discussed this with the patient and his wife who were pleased with the reassurance and would like to avoid surgical intervention at this point in time.  I counseled him on signs and symptoms of strangulated or incarcerated hernias.  We will follow up PRN.

## 2023-01-18 ENCOUNTER — Other Ambulatory Visit (HOSPITAL_COMMUNITY): Payer: Self-pay | Admitting: Nurse Practitioner

## 2023-01-18 MED ORDER — ALBUTEROL SULFATE HFA 90 MCG/ACTUATION AEROSOL INHALER
1.0000 | INHALATION_SPRAY | RESPIRATORY_TRACT | 1 refills | Status: AC | PRN
Start: 2023-01-18 — End: ?

## 2023-03-15 ENCOUNTER — Other Ambulatory Visit (HOSPITAL_COMMUNITY): Payer: Self-pay | Admitting: FAMILY PRACTICE

## 2023-03-15 ENCOUNTER — Other Ambulatory Visit: Payer: Self-pay

## 2023-03-15 DIAGNOSIS — R002 Palpitations: Secondary | ICD-10-CM

## 2023-04-10 ENCOUNTER — Other Ambulatory Visit (HOSPITAL_COMMUNITY): Payer: Self-pay | Admitting: FAMILY PRACTICE

## 2023-04-10 DIAGNOSIS — R002 Palpitations: Secondary | ICD-10-CM

## 2023-04-11 ENCOUNTER — Other Ambulatory Visit: Payer: Self-pay

## 2023-04-11 ENCOUNTER — Ambulatory Visit
Admission: RE | Admit: 2023-04-11 | Discharge: 2023-04-11 | Disposition: A | Discharge status: Home / Self Care | Payer: BC Managed Care – PPO | Source: Ambulatory Visit | Attending: FAMILY PRACTICE | Admitting: FAMILY PRACTICE

## 2023-04-11 DIAGNOSIS — R002 Palpitations: Secondary | ICD-10-CM | POA: Insufficient documentation

## 2023-04-17 ENCOUNTER — Other Ambulatory Visit: Payer: Self-pay

## 2023-04-17 ENCOUNTER — Ambulatory Visit
Admission: RE | Admit: 2023-04-17 | Discharge: 2023-04-17 | Disposition: A | Discharge status: Home / Self Care | Payer: BC Managed Care – PPO | Source: Ambulatory Visit | Attending: FAMILY PRACTICE | Admitting: FAMILY PRACTICE

## 2023-04-17 DIAGNOSIS — R002 Palpitations: Secondary | ICD-10-CM | POA: Insufficient documentation

## 2023-04-17 DIAGNOSIS — I2510 Atherosclerotic heart disease of native coronary artery without angina pectoris: Secondary | ICD-10-CM

## 2023-10-31 ENCOUNTER — Ambulatory Visit (INDEPENDENT_AMBULATORY_CARE_PROVIDER_SITE_OTHER): Admitting: Surgery

## 2023-10-31 ENCOUNTER — Encounter (INDEPENDENT_AMBULATORY_CARE_PROVIDER_SITE_OTHER): Payer: Self-pay | Admitting: Surgery

## 2023-10-31 ENCOUNTER — Other Ambulatory Visit (INDEPENDENT_AMBULATORY_CARE_PROVIDER_SITE_OTHER): Payer: Self-pay | Admitting: Surgery

## 2023-10-31 ENCOUNTER — Other Ambulatory Visit: Payer: Self-pay

## 2023-10-31 VITALS — BP 132/78 | HR 96 | Temp 97.6°F | Resp 18 | Ht 70.0 in | Wt 233.0 lb

## 2023-10-31 DIAGNOSIS — K432 Incisional hernia without obstruction or gangrene: Secondary | ICD-10-CM

## 2023-10-31 DIAGNOSIS — R1011 Right upper quadrant pain: Secondary | ICD-10-CM

## 2023-10-31 DIAGNOSIS — R109 Unspecified abdominal pain: Secondary | ICD-10-CM

## 2023-10-31 NOTE — Progress Notes (Signed)
 GENERAL SURGERY, [facility] MEDICAL GROUP GENERAL SURGERY  [address]  [place] [address]    Progress Note    Name: Billy Aguirre MRN:  [id number]   Date: 10/31/2023 DOB:  06-16-1960 (63 y.o.)                Date of Service:  10/31/2023  Billy Aguirre, 63 y.o. male  Date of Birth:  18-Mar-1961  PCP: Manus Endo, MD  Referring:  No ref. provider found     HPI:  Billy Aguirre is a 63 y.o. White male who returns for evaluation because of upper abdominal discomfort for the past month and a half.  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  They are taking a watchful waiting approach at this point.  The patient states that his discomfort has been in the right upper quadrant and radiates in a bandlike distribution across the epigastrium to the left side.  This has associated with some nausea but no vomiting.          Past Medical History:   Diagnosis Date    Esophageal reflux     Hypertension       Past Surgical History:   Procedure Laterality Date    EYE SURGERY      FOOT SURGERY Bilateral     HX AORTIC VALVE REPLACEMENT      KNEE SURGERY Bilateral       Outpatient Medications Marked as Taking for the 10/31/23 encounter (Office Visit) with Kashonda Sarkisyan B, MD   Medication Sig    albuterol  sulfate (PROAIR  HFA) 90 mcg/actuation Inhalation oral inhaler Take 1-2 Puffs by inhalation Every 4 hours as needed    aspirin (ECOTRIN) 81 mg Oral Tablet, Delayed Release (E.C.) Take 1 Tablet (81 mg total) by mouth Once a day    atorvastatin (LIPITOR) 40 mg Oral Tablet Take 1 Tablet (40 mg total) by mouth    Diclofenac-Misoprostol 50-200 mg-mcg Oral Tab,IR & Delay Rel,Multiphasic Take 1 Tablet by mouth Twice daily    hydroCHLOROthiazide (MICROZIDE) 12.5 mg Oral Capsule Take 1 Capsule (12.5 mg total) by mouth Once a day    loratadine (CLARITIN) 10 mg Oral Tablet Take 1 Tablet (10 mg total) by mouth Once per day as needed    metoprolol  succinate (TOPROL-XL) 25 mg Oral Tablet Sustained Release 24 hr 0.5 Tablets (12.5 mg total)    pantoprazole (PROTONIX) 40 mg Oral Tablet, Delayed Release (E.C.) Take 1 Tablet (40 mg total) by mouth Once a day    PLAVIX 75 mg Oral Tablet Take 1 Tablet (75 mg total) by mouth      Allergies[1]        BP 132/78   Pulse 96   Temp 36.4 C (97.6 F)   Resp 18   Ht 1.778 m (5' 10)   Wt 106 kg (233 lb)   SpO2 97%   BMI 33.43 kg/m          General: appropriate for age. in no acute distress.    Vital signs are present above and have been reviewed by me     HEENT: Atraumatic, Normocephalic.    Lungs: Nonlabored breathing with symmetric expansion    Heart:Regular wth respect to rate and rythmn.    Abdomen:Soft.  Minimal discomfort to deep palpation in right upper quadrant but no rebound guarding or peritoneal signs. Nondistended and otherwise benign    Psychiatric:  Alert and oriented to person, place, and time. affect appropriate       Assessment/Plan:  Assessment/Plan   1. Right upper quadrant abdominal pain    2. Incisional hernia, without obstruction or gangrene         The patient will be evaluated with gallbladder ultrasound and if negative for stones HIDA scan with ejection fraction.  Based on these findings we will then determine the course of management.  The patient understood the plan of therapy and agreed to that plan.    No follow-ups on file.     This note was partially created using voice recognition software and is inherently subject to errors including those of syntax and sound alike  substitutions which may escape proof reading. In such instances, original meaning may be extrapolated by contextual derivation.    Tyrice Hewitt B Malillany Kazlauskas, MD,MBA,FACS       [1] No Known Allergies

## 2023-11-06 ENCOUNTER — Other Ambulatory Visit: Payer: Self-pay

## 2023-11-07 ENCOUNTER — Other Ambulatory Visit (INDEPENDENT_AMBULATORY_CARE_PROVIDER_SITE_OTHER): Payer: Self-pay | Admitting: Surgery

## 2023-11-07 ENCOUNTER — Encounter (INDEPENDENT_AMBULATORY_CARE_PROVIDER_SITE_OTHER): Payer: Self-pay | Admitting: Surgery

## 2023-11-07 ENCOUNTER — Inpatient Hospital Stay (INDEPENDENT_AMBULATORY_CARE_PROVIDER_SITE_OTHER)
Admission: RE | Admit: 2023-11-07 | Discharge: 2023-11-07 | Discharge status: Home / Self Care | Payer: Self-pay | Source: Ambulatory Visit

## 2023-11-07 DIAGNOSIS — K8020 Calculus of gallbladder without cholecystitis without obstruction: Secondary | ICD-10-CM

## 2023-11-07 DIAGNOSIS — Z01818 Encounter for other preprocedural examination: Secondary | ICD-10-CM

## 2023-11-07 DIAGNOSIS — R109 Unspecified abdominal pain: Secondary | ICD-10-CM

## 2023-11-08 ENCOUNTER — Ambulatory Visit: Attending: Surgery

## 2023-11-08 ENCOUNTER — Other Ambulatory Visit: Payer: Self-pay

## 2023-11-08 ENCOUNTER — Ambulatory Visit (HOSPITAL_COMMUNITY)
Admission: RE | Admit: 2023-11-08 | Discharge: 2023-11-08 | Disposition: A | Discharge status: Home / Self Care | Source: Ambulatory Visit | Attending: Surgery

## 2023-11-08 DIAGNOSIS — Z01818 Encounter for other preprocedural examination: Secondary | ICD-10-CM | POA: Insufficient documentation

## 2023-11-08 DIAGNOSIS — K8020 Calculus of gallbladder without cholecystitis without obstruction: Secondary | ICD-10-CM | POA: Insufficient documentation

## 2023-11-08 DIAGNOSIS — Z01810 Encounter for preprocedural cardiovascular examination: Secondary | ICD-10-CM

## 2023-11-08 DIAGNOSIS — I440 Atrioventricular block, first degree: Secondary | ICD-10-CM

## 2023-11-08 DIAGNOSIS — R109 Unspecified abdominal pain: Secondary | ICD-10-CM

## 2023-11-08 DIAGNOSIS — R9431 Abnormal electrocardiogram [ECG] [EKG]: Secondary | ICD-10-CM

## 2023-11-08 LAB — CBC WITH DIFF
BASOPHIL #: 0 x10ˆ3/uL (ref 0.00–0.10)
BASOPHIL %: 1 % (ref 0–1)
EOSINOPHIL #: 0.2 x10ˆ3/uL (ref 0.00–0.60)
EOSINOPHIL %: 4 % (ref 1–8)
HCT: 44.6 % (ref 36.7–47.1)
HGB: 15.4 g/dL (ref 12.5–16.3)
LYMPHOCYTE #: 1.3 x10ˆ3/uL (ref 1.00–3.00)
LYMPHOCYTE %: 25 % (ref 15–43)
MCH: 30.3 pg (ref 23.8–33.4)
MCHC: 34.5 g/dL (ref 32.5–36.3)
MCV: 87.9 fL (ref 73.0–96.2)
MONOCYTE #: 0.4 x10ˆ3/uL (ref 0.30–1.10)
MONOCYTE %: 8 % (ref 6–14)
MPV: 8.4 fL (ref 7.4–11.4)
NEUTROPHIL #: 3.4 x10ˆ3/uL (ref 1.85–7.84)
NEUTROPHIL %: 63 % (ref 44–74)
PLATELETS: 173 x10ˆ3/uL (ref 140–440)
RBC: 5.08 x10ˆ6/uL (ref 4.06–5.63)
RDW: 13.5 % (ref 12.1–16.2)
WBC: 5.4 x10ˆ3/uL (ref 3.6–10.2)

## 2023-11-08 LAB — COMPREHENSIVE METABOLIC PANEL, NON-FASTING
ALBUMIN/GLOBULIN RATIO: 2 — ABNORMAL HIGH (ref 0.8–1.4)
ALBUMIN: 4.5 g/dL (ref 3.5–5.7)
ALKALINE PHOSPHATASE: 58 U/L (ref 34–104)
ALT (SGPT): 22 U/L (ref 7–52)
ANION GAP: 6 mmol/L (ref 4–13)
AST (SGOT): 17 U/L (ref 13–39)
BILIRUBIN TOTAL: 0.7 mg/dL (ref 0.3–1.0)
BUN/CREA RATIO: 22 (ref 6–22)
BUN: 20 mg/dL (ref 7–25)
CALCIUM, CORRECTED: 8.8 mg/dL — ABNORMAL LOW (ref 8.9–10.8)
CALCIUM: 9.2 mg/dL (ref 8.6–10.3)
CHLORIDE: 108 mmol/L — ABNORMAL HIGH (ref 98–107)
CO2 TOTAL: 29 mmol/L (ref 21–31)
CREATININE: 0.9 mg/dL (ref 0.60–1.30)
ESTIMATED GFR: 97 mL/min/1.73mˆ2 (ref >59)
GLOBULIN: 2.2 (ref 2.0–3.5)
GLUCOSE: 84 mg/dL (ref 74–109)
OSMOLALITY, CALCULATED: 287 mosm/kg (ref 270–290)
POTASSIUM: 4.3 mmol/L (ref 3.5–5.1)
PROTEIN TOTAL: 6.7 g/dL (ref 6.4–8.9)
SODIUM: 143 mmol/L (ref 136–145)

## 2023-11-08 LAB — ECG 12 LEAD
Atrial Rate: 74 {beats}/min
Calculated P Axis: 65 degrees
Calculated R Axis: 96 degrees
Calculated T Axis: 31 degrees
PR Interval: 248 ms
QRS Duration: 80 ms
QT Interval: 350 ms
QTC Calculation: 388 ms
Ventricular rate: 74 {beats}/min

## 2023-11-16 ENCOUNTER — Ambulatory Visit (HOSPITAL_COMMUNITY): Admitting: Surgery

## 2023-11-16 ENCOUNTER — Ambulatory Visit (HOSPITAL_COMMUNITY): Admitting: Anesthesiology

## 2023-11-16 ENCOUNTER — Encounter (HOSPITAL_COMMUNITY)
Admission: RE | Disposition: A | Discharge status: Home / Self Care | Payer: Self-pay | Source: Ambulatory Visit | Attending: Surgery

## 2023-11-16 ENCOUNTER — Ambulatory Visit
Admission: RE | Admit: 2023-11-16 | Discharge: 2023-11-16 | Disposition: A | Discharge status: Home / Self Care | Source: Ambulatory Visit | Attending: Surgery | Admitting: Surgery

## 2023-11-16 ENCOUNTER — Other Ambulatory Visit: Payer: Self-pay

## 2023-11-16 ENCOUNTER — Ambulatory Visit
Admission: RE | Admit: 2023-11-16 | Discharge: 2023-11-16 | Disposition: A | Discharge status: Home / Self Care | Source: Ambulatory Visit | Attending: Surgery

## 2023-11-16 ENCOUNTER — Encounter (HOSPITAL_COMMUNITY): Payer: Self-pay | Admitting: Surgery

## 2023-11-16 DIAGNOSIS — K8010 Calculus of gallbladder with chronic cholecystitis without obstruction: Secondary | ICD-10-CM | POA: Insufficient documentation

## 2023-11-16 SURGERY — CHOLECYSTECTOMY LAPAROSCOPIC
Anesthesia: General | Site: Abdomen | Wound class: Clean Contaminated Wounds-The respiratory, GI, Genital, or urinary

## 2023-11-16 MED ORDER — SODIUM CHLORIDE 0.9 % INTRAVENOUS SOLUTION
INTRAVENOUS | Status: AC
Start: 2023-11-16 — End: 2023-11-16
  Filled 2023-11-16: qty 50

## 2023-11-16 MED ORDER — METOCLOPRAMIDE 5 MG/ML INJECTION SOLUTION
10.0000 mg | Freq: Four times a day (QID) | INTRAMUSCULAR | Status: DC | PRN
Start: 2023-11-16 — End: 2023-11-16

## 2023-11-16 MED ORDER — MIDAZOLAM 5 MG/ML INJECTION WRAPPER
Freq: Once | INTRAMUSCULAR | Status: DC | PRN
Start: 2023-11-16 — End: 2023-11-16
  Administered 2023-11-16 (×2): 2.5 mg via INTRAVENOUS

## 2023-11-16 MED ORDER — HYDROMORPHONE 2 MG/ML INJECTION WRAPPER
0.2000 mg | INJECTION | INTRAMUSCULAR | Status: DC | PRN
Start: 2023-11-16 — End: 2023-11-16

## 2023-11-16 MED ORDER — LIDOCAINE (PF) 100 MG/5 ML (2 %) INTRAVENOUS SYRINGE
INJECTION | Freq: Once | INTRAVENOUS | Status: DC | PRN
Start: 2023-11-16 — End: 2023-11-16
  Administered 2023-11-16: 60 mg via INTRAVENOUS

## 2023-11-16 MED ORDER — FENTANYL (PF) 50 MCG/ML INJECTION SOLUTION
INTRAMUSCULAR | Status: AC
Start: 2023-11-16 — End: 2023-11-16
  Filled 2023-11-16: qty 2

## 2023-11-16 MED ORDER — ROPIVACAINE (PF) 2 MG/ML (0.2 %) INJECTION SOLUTION
Freq: Once | INTRAMUSCULAR | Status: DC | PRN
Start: 2023-11-16 — End: 2023-11-16
  Administered 2023-11-16: 40 mL via INTRAMUSCULAR

## 2023-11-16 MED ORDER — ACETAMINOPHEN 1,000 MG/100 ML (10 MG/ML) INTRAVENOUS SOLUTION
Freq: Once | INTRAVENOUS | Status: DC | PRN
Start: 2023-11-16 — End: 2023-11-16
  Administered 2023-11-16: 1000 mg via INTRAVENOUS

## 2023-11-16 MED ORDER — SODIUM CHLORIDE 0.9% FLUSH BAG - 250 ML
INTRAVENOUS | Status: DC | PRN
Start: 2023-11-16 — End: 2023-11-16

## 2023-11-16 MED ORDER — GLYCOPYRROLATE 0.2 MG/ML INJECTION SOLUTION
Freq: Once | INTRAMUSCULAR | Status: DC | PRN
Start: 2023-11-16 — End: 2023-11-16
  Administered 2023-11-16: .2 mg via INTRAVENOUS

## 2023-11-16 MED ORDER — SODIUM CHLORIDE 0.9 % (FLUSH) INJECTION SYRINGE
3.0000 mL | INJECTION | Freq: Three times a day (TID) | INTRAMUSCULAR | Status: DC
Start: 2023-11-16 — End: 2023-11-16

## 2023-11-16 MED ORDER — ACETAMINOPHEN 325 MG TABLET
650.0000 mg | ORAL_TABLET | ORAL | Status: DC | PRN
Start: 2023-11-16 — End: 2023-11-16

## 2023-11-16 MED ORDER — CEFAZOLIN 1 GRAM SOLUTION FOR INJECTION
Freq: Once | INTRAMUSCULAR | Status: DC | PRN
Start: 2023-11-16 — End: 2023-11-16
  Administered 2023-11-16: 2000 mg via INTRAVENOUS

## 2023-11-16 MED ORDER — BACITRACIN 500 UNIT/G OINTMENT TUBE
TOPICAL_OINTMENT | CUTANEOUS | Status: AC
Start: 2023-11-16 — End: 2023-11-16
  Filled 2023-11-16: qty 28.4

## 2023-11-16 MED ORDER — ETHYL ALCOHOL 62 % TOPICAL SWAB
1.0000 | Freq: Once | CUTANEOUS | Status: AC
Start: 2023-11-16 — End: 2023-11-16
  Administered 2023-11-16: 1 via NASAL

## 2023-11-16 MED ORDER — LACTATED RINGERS INTRAVENOUS SOLUTION
INTRAVENOUS | Status: DC
Start: 2023-11-16 — End: 2023-11-16

## 2023-11-16 MED ORDER — HYDROCODONE 5 MG-ACETAMINOPHEN 325 MG TABLET
1.0000 | ORAL_TABLET | ORAL | Status: DC | PRN
Start: 2023-11-16 — End: 2023-11-16
  Administered 2023-11-16: 1 via ORAL
  Filled 2023-11-16: qty 1

## 2023-11-16 MED ORDER — ROCURONIUM 10 MG/ML INTRAVENOUS SOLUTION
Freq: Once | INTRAVENOUS | Status: DC | PRN
Start: 2023-11-16 — End: 2023-11-16
  Administered 2023-11-16: 25 mg via INTRAVENOUS
  Administered 2023-11-16: 5 mg via INTRAVENOUS

## 2023-11-16 MED ORDER — HYDROCODONE 7.5 MG-ACETAMINOPHEN 325 MG TABLET
1.0000 | ORAL_TABLET | Freq: Four times a day (QID) | ORAL | 0 refills | Status: AC | PRN
Start: 2023-11-16 — End: ?

## 2023-11-16 MED ORDER — ONDANSETRON HCL (PF) 4 MG/2 ML INJECTION SOLUTION
4.0000 mg | Freq: Four times a day (QID) | INTRAMUSCULAR | Status: DC | PRN
Start: 2023-11-16 — End: 2023-11-16

## 2023-11-16 MED ORDER — ONDANSETRON 4 MG DISINTEGRATING TABLET
4.0000 mg | ORAL_TABLET | Freq: Four times a day (QID) | ORAL | Status: DC | PRN
Start: 2023-11-16 — End: 2023-11-16

## 2023-11-16 MED ORDER — IPRATROPIUM 0.5 MG-ALBUTEROL 3 MG (2.5 MG BASE)/3 ML NEBULIZATION SOLN
3.0000 mL | INHALATION_SOLUTION | Freq: Once | RESPIRATORY_TRACT | Status: DC | PRN
Start: 2023-11-16 — End: 2023-11-16

## 2023-11-16 MED ORDER — SODIUM CHLORIDE 0.9 % (FLUSH) INJECTION SYRINGE
3.0000 mL | INJECTION | INTRAMUSCULAR | Status: DC | PRN
Start: 2023-11-16 — End: 2023-11-16

## 2023-11-16 MED ORDER — ACETAMINOPHEN 1,000 MG/100 ML (10 MG/ML) INTRAVENOUS SOLUTION
INTRAVENOUS | Status: AC
Start: 2023-11-16 — End: 2023-11-16
  Filled 2023-11-16: qty 100

## 2023-11-16 MED ORDER — LACTATED RINGERS INTRAVENOUS SOLUTION
INTRAVENOUS | Status: DC
Start: 2023-11-16 — End: 2023-11-16
  Administered 2023-11-16: 0 via INTRAVENOUS

## 2023-11-16 MED ORDER — SUCCINYLCHOLINE 20 MG/ML INTRAVENOUS WRAPPER
INJECTION | Freq: Once | INTRAVENOUS | Status: DC | PRN
Start: 2023-11-16 — End: 2023-11-16
  Administered 2023-11-16: 140 mg via INTRAVENOUS

## 2023-11-16 MED ORDER — FENTANYL (PF) 50 MCG/ML INJECTION WRAPPER
50.0000 ug | INJECTION | INTRAMUSCULAR | Status: DC | PRN
Start: 2023-11-16 — End: 2023-11-16

## 2023-11-16 MED ORDER — ONDANSETRON HCL (PF) 4 MG/2 ML INJECTION SOLUTION
Freq: Once | INTRAMUSCULAR | Status: DC | PRN
Start: 2023-11-16 — End: 2023-11-16
  Administered 2023-11-16: 4 mg via INTRAVENOUS

## 2023-11-16 MED ORDER — ROPIVACAINE (PF) 2 MG/ML (0.2 %) INJECTION SOLUTION
INTRAMUSCULAR | Status: AC
Start: 2023-11-16 — End: 2023-11-16
  Filled 2023-11-16: qty 40

## 2023-11-16 MED ORDER — IOPAMIDOL 370 MG IODINE/ML (76 %) INTRAVENOUS SOLUTION
Freq: Once | INTRAVENOUS | Status: DC | PRN
Start: 2023-11-16 — End: 2023-11-16
  Administered 2023-11-16: 4 mL via INTRAMUSCULAR

## 2023-11-16 MED ORDER — KETOROLAC 30 MG/ML (1 ML) INJECTION SOLUTION
15.0000 mg | Freq: Four times a day (QID) | INTRAMUSCULAR | Status: DC | PRN
Start: 2023-11-16 — End: 2023-11-16

## 2023-11-16 MED ORDER — SUGAMMADEX 100 MG/ML INTRAVENOUS SOLUTION
Freq: Once | INTRAVENOUS | Status: DC | PRN
Start: 2023-11-16 — End: 2023-11-16
  Administered 2023-11-16: 300 mg via INTRAVENOUS

## 2023-11-16 MED ORDER — ALBUTEROL SULFATE 2.5 MG/3 ML (0.083 %) SOLUTION FOR NEBULIZATION
2.5000 mg | INHALATION_SOLUTION | Freq: Once | RESPIRATORY_TRACT | Status: DC | PRN
Start: 2023-11-16 — End: 2023-11-16

## 2023-11-16 MED ORDER — FENTANYL (PF) 50 MCG/ML INJECTION WRAPPER
INJECTION | Freq: Once | INTRAMUSCULAR | Status: DC | PRN
Start: 2023-11-16 — End: 2023-11-16
  Administered 2023-11-16: 100 ug via INTRAVENOUS
  Administered 2023-11-16 (×2): 50 ug via INTRAVENOUS

## 2023-11-16 MED ORDER — ONDANSETRON HCL (PF) 4 MG/2 ML INJECTION SOLUTION
INTRAMUSCULAR | Status: AC
Start: 2023-11-16 — End: 2023-11-16
  Filled 2023-11-16: qty 2

## 2023-11-16 MED ORDER — DEXAMETHASONE SODIUM PHOSPHATE 4 MG/ML INJECTION SOLUTION
Freq: Once | INTRAMUSCULAR | Status: DC | PRN
Start: 2023-11-16 — End: 2023-11-16
  Administered 2023-11-16: 4 mg via INTRAVENOUS

## 2023-11-16 MED ORDER — ONDANSETRON 4 MG DISINTEGRATING TABLET: 4.0000 mg | ORAL_TABLET | Freq: Three times a day (TID) | ORAL | 0 refills | Status: DC | PRN

## 2023-11-16 MED ORDER — CEFAZOLIN 2 GRAM INTRAVENOUS SOLUTION
INTRAVENOUS | Status: AC
Start: 2023-11-16 — End: 2023-11-16
  Filled 2023-11-16: qty 14.71

## 2023-11-16 MED ORDER — DEXTROSE 5% IN WATER (D5W) FLUSH BAG - 250 ML
INTRAVENOUS | Status: DC | PRN
Start: 2023-11-16 — End: 2023-11-16

## 2023-11-16 MED ORDER — FENTANYL (PF) 50 MCG/ML INJECTION WRAPPER
25.0000 ug | INJECTION | INTRAMUSCULAR | Status: DC | PRN
Start: 2023-11-16 — End: 2023-11-16

## 2023-11-16 MED ORDER — PROPOFOL 10 MG/ML IV BOLUS
INJECTION | Freq: Once | INTRAVENOUS | Status: DC | PRN
Start: 2023-11-16 — End: 2023-11-16
  Administered 2023-11-16: 200 mg via INTRAVENOUS

## 2023-11-16 MED ORDER — BACITRACIN ZINC 500 UNIT-POLYMYXIN B 10,000 UNIT/GRAM TOPICAL OINTMENT
TOPICAL_OINTMENT | Freq: Once | CUTANEOUS | Status: DC | PRN
Start: 2023-11-16 — End: 2023-11-16
  Administered 2023-11-16: 5 mL via TOPICAL

## 2023-11-16 MED ORDER — PROCHLORPERAZINE EDISYLATE 10 MG/2 ML (5 MG/ML) INJECTION SOLUTION
5.0000 mg | Freq: Once | INTRAMUSCULAR | Status: DC | PRN
Start: 2023-11-16 — End: 2023-11-16

## 2023-11-16 MED ORDER — ONDANSETRON HCL (PF) 4 MG/2 ML INJECTION SOLUTION
4.0000 mg | Freq: Once | INTRAMUSCULAR | Status: DC | PRN
Start: 2023-11-16 — End: 2023-11-16
  Administered 2023-11-16: 4 mg via INTRAVENOUS

## 2023-11-16 MED ORDER — MIDAZOLAM 5 MG/ML INJECTION WRAPPER
INTRAMUSCULAR | Status: AC
Start: 2023-11-16 — End: 2023-11-16
  Filled 2023-11-16: qty 1

## 2023-11-16 SURGICAL SUPPLY — 38 items
ADH LIQUID LF  WTPRF VIAL PREP NONSTAIN MASTISOL STYRAX GUM MASTIC ALC MTHY SLCYT STRL CLR NHZR 2/3 (WOUND CARE SUPPLY) ×1 IMPLANT
APPL ENDOS SRGCL 73.6OZ (ENDOSCOPIC SUPPLIES) ×1 IMPLANT
BLADE 11 2 END CBNSTL SURG STRL DISP (SURGICAL CUTTING SUPPLIES) ×1 IMPLANT
CANNULA LAPSCP 5MM 100MM VERSAONE STD UNIV FIX BLDLS DLPHN NOSE TIP LOW PROF STRL LF  DISP (ENDOSCOPIC SUPPLIES) ×3 IMPLANT
CATH CHOLANG REDDICK 4FR SCP TIP STRL LTX DISP (ENDOSCOPIC SUPPLIES) ×1 IMPLANT
CLEANER INSTRUMENT PRE-KLENZ 13.5 OZ (MISCELLANEOUS PT CARE ITEMS) ×1 IMPLANT
CLIP MED LRG INTERNAL WECK HEMOLOK PLMR LGT NONAB CART STRL DISP LF  GRN (SUTURE AIDS) ×3 IMPLANT
CONTAINR HISTO C90ML 10% NEUT BF FRMLN POLYPROP PREFL 60ML (SPECIMEN COLLECTION SUPPLIES) ×1 IMPLANT
COUNTER 20 CNT BLOCK ADH NEEDLE STRL LF  RD SHARP FOAM 15.75X11.5X14IN DISP (MED SURG SUPPLIES) ×1 IMPLANT
COVER 53X24IN MAYOSTAND PRXM STRL DISP EQP SMS LF (DRAPE/PACKS/SHEETS/OR TOWEL) ×1 IMPLANT
COVER EQP 90X60IN HVDTY BCK PAD FNFLD BLU STRL (DRAPE/PACKS/SHEETS/OR TOWEL) ×2 IMPLANT
DEVICE SPEC RETR INZII 10MM GUIDE BEAD STD ENDOS 225ML LF (ENDOSCOPIC SUPPLIES) ×1 IMPLANT
DRAPE 76X55IN 3 QT HALYARD LF  STRL DISP SURG (DRAPE/PACKS/SHEETS/OR TOWEL) ×1 IMPLANT
DRAPE SURG KC100 LAP CHOLE REINF ARMBRD CVR HKLP TUBE HLDR FEN 104X76X120IN STD BSC STRL (DRAPE/PACKS/SHEETS/OR TOWEL) ×1 IMPLANT
DRESS TRNSPR 4.75X4IN POLYUR ADH HYPOALL WTPRF TGDRM STD STRL LF (WOUND CARE SUPPLY) ×1 IMPLANT
GLOVE SURG 6 LF  PF BEAD CUF STRL CRM 11.3IN PROTEXIS PLISPRN THK9.1 MIL (GLOVES AND ACCESSORIES) ×1 IMPLANT
GLOVE SURG 7 LF  PF BEAD CUF STRL CRM 11.8IN PROTEXIS PI PLISPRN THK9.1 MIL (GLOVES AND ACCESSORIES) ×6 IMPLANT
GOWN SURG LRG STD LGTH L3 HKLP CLSR RGLN SLEEVE TWL STRL LF  DISP GRN AERO BLU PRFRM FBRC (DRAPE/PACKS/SHEETS/OR TOWEL) ×2 IMPLANT
GOWN SURG XL STD LGTH L3 HKLP CLSR RGLN SLEEVE TWL STRL LF  DISP GRN AERO BLU PRFRM FBRC (DRAPE/PACKS/SHEETS/OR TOWEL) ×2 IMPLANT
HEMOSTAT ABS POWDER SRGCL OXD REGENERATED CLU (WOUND CARE SUPPLY) ×1 IMPLANT
IRR SUCT 10FT STRKFL TUBE 2 SPIKE STRL LF  DISP (ENDOSCOPIC SUPPLIES) ×1 IMPLANT
ISOVUE 370 100ML 131635 10/CS (CONTRAST) ×1 IMPLANT
LABEL MED CORRECT MED LABELING SYS 4 FLG 2 SHEET 24 PRPRNT STRL (MED SURG SUPPLIES) ×1 IMPLANT
NEEDLE HYPO  23GA 1.5IN TW PRCSNGL SS POLYPROP REG BVL LL HUB DEHP-FR TRQS STRL LF  DISP (MED SURG SUPPLIES) ×2 IMPLANT
NEEDLE INSFL 120MM 14GA STD SRGNDL PNMPRTN SPRG LD BLUNT STY STRL LF  DISP SS PLASTIC RD (ENDOSCOPIC SUPPLIES) ×1 IMPLANT
SET TUBING PNEUMOCLEAR HIFLO SMOKE EVAC (MED SURG SUPPLIES) ×1 IMPLANT
SOL ANFG DFGR ISOPRPNL PAD OVAL BTL NABRSV ADH STRL LF  DISP (ENDOSCOPIC SUPPLIES) ×1 IMPLANT
SOL IV LR 1000ML PRSV FR FLXB CONTAINR LF (MEDICATIONS/SOLUTIONS) ×1 IMPLANT
SPONGE GAUZE 4X4IN AV GZ CLU COTTON ABS NWVN POSTOP LF  STRL DISP (WOUND CARE SUPPLY) ×1 IMPLANT
SPONGE GAUZE 4X4IN MDCHC COTTON 12 PLY TY 7 LF  STRL DISP (WOUND CARE SUPPLY) IMPLANT
SPONGE SURG 4X4IN 16 PLY XRY DETECT COTTON STRL LF  DISP (WOUND CARE SUPPLY) ×1 IMPLANT
STRIP 4X.5IN STRSTRP PLSTR REINF SKNCLS WHT STRL LF (WOUND CARE SUPPLY) ×1 IMPLANT
SUTURE 0 CT2 VICRYL+ 27IN VIOL BRD ANBCTRL COAT ABS (SUTURE/WOUND CLOSURE) ×1 IMPLANT
SUTURE 5-0 C-13 POLYSRB 30IN UNDYED BRD COAT ABS (SUTURE/WOUND CLOSURE) ×2 IMPLANT
SYRINGE LL 10ML LF  STRL GRAD N-PYRG DEHP-FR PVC FREE MED DISP (MED SURG SUPPLIES) ×4 IMPLANT
TOWEL 24X16IN COTTON BLU DISP SURG STRL LF (DRAPE/PACKS/SHEETS/OR TOWEL) ×2 IMPLANT
TROCAR LAPSCP STD 100MM 11MM VERSAONE FIX CANN BLDLS DLPHN NOSE TIP OPTC STRL LF  DISP (ENDOSCOPIC SUPPLIES) ×1 IMPLANT
TROCAR LAPSCP STD 100MM 5MM VERSAONE FIX CANN BLDLS DLPHN NOSE TIP OPTC STRL LF  DISP (ENDOSCOPIC SUPPLIES) ×1 IMPLANT

## 2023-11-16 NOTE — H&P (Signed)
 [facility]  General Surgery  History and Physical    Date of Service:  11/16/2023  Billy Aguirre, Billy Aguirre, 63 y.o. male  Date of Admission:  (Not on file)  Date of Birth:  16-Jun-1960  PCP: Billy Endo, MD    Reason for admission:  Laparoscopic cholecystectomy with cholangiogram    HPI:  Billy Aguirre is a 63 y.o. White male who is admitted for Gallstones [K80.20]     Billy Aguirre is a 63 y.o. White male who returns for evaluation because of upper abdominal discomfort for the past month and a half.  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  They are taking a watchful waiting approach at this point.  The patient states that his discomfort has been in the right upper quadrant and radiates in a bandlike distribution across the epigastrium to the left side.  This has associated with some nausea but no vomiting.  Ultrasound showed multiple gallstones.    Past Medical History:   Diagnosis Date    Esophageal reflux     Hypertension       Past Surgical History:   Procedure Laterality Date    EYE SURGERY      FOOT SURGERY Bilateral     HX AORTIC VALVE REPLACEMENT      KNEE SURGERY Bilateral       Social History[1]    Family Medical History:    None         Cannot display prior to admission medications because the patient has not been admitted in this contact.           Allergies[2]       No data found.       General: appropriate for age. in no acute distress.    Vital signs are present above and have been reviewed by me     HEENT: Atraumatic, Normocephalic. PERRLA, EOMI. Nose clear. Throat clear.    Lungs: Nonlabored breathing with symmetric expansion.  Clear to auscultation bilaterally    Heart:Regular wth respect to rate and rythmn.    Abdomen:Soft.  Minimal discomfort to deep palpation in right upper quadrant but no rebound guarding or peritoneal signs. Nondistended and otherwise benign     Extremities:  Grossly  normal with good range of motion and no major deformities.    Neuro:  Grossly normal motor and sensory function. CN's II through XII intact.    Psychiatric: Alert and oriented to person, place, and time. affect appropriate    Laboratory Data:     No results found for any visits on 11/16/23 (from the past 24 hours).    Imaging Studies:    FLUORO CHOLANGIOGRAM OPERATIVE    (Results Pending)        Assessment/Plan:  Gallstones [K80.20]    Laparoscopic cholecystectomy with cholangiogram scheduled for 11/16/2023    Risks, benefits, indications, and complications of laparoscopic possible open cholecystectomy were discussed in detail including but not limited to conversion to open procedure, common bile duct injury, bleeding, retained stone, postoperative bile leak, postoperative hernia, postoperative diarrhea, reaction to anesthesia, injury to associated or surrounding organs, wound infection, and remote possibility of death.  Also discussed the possibility of lack of resolution of all preoperative symptoms from the procedure.  The patient was given ample opportunity to ask questions and all questions were answered to their satisfaction. The patient voices understanding of the procedure and wishes to  proceed with surgery.  Informed consent clearly obtained.    This note was partially created using voice recognition software and is inherently subject to errors including those of syntax and sound alike  substitutions which may escape proof reading. In such instances, original meaning may be extrapolated by contextual derivation.    Billy Aguirre B Billy Dowlen, MD, MBA, FACS         [1]   Social History  Tobacco Use    Smoking status: Never    Smokeless tobacco: Never   Vaping Use    Vaping status: Never Used   Substance Use Topics    Alcohol  use: Not Currently    Drug use: Not Currently   [2] No Known Allergies

## 2023-11-16 NOTE — Anesthesia Postprocedure Evaluation (Signed)
 Anesthesia Post Op Evaluation    Patient: Billy Aguirre  Procedure(s):  LAPAROSCOPIC CHOLECYSTECTOMY USING CHOLANGIOGRAM WITH FLUOROSCOPY    Last Vitals:Temperature: 36.4 C (97.5 F) (11/16/23 1127)  Heart Rate: 72 (11/16/23 1145)  BP (Non-Invasive): (!) 143/88 (11/16/23 1145)  Respiratory Rate: 19 (11/16/23 1145)  SpO2: 96 % (11/16/23 1145)    No notable events documented.    Patient is sufficiently recovered from the effects of anesthesia to participate in the evaluation and has returned to their pre-procedure level.  Patient location during evaluation: PACU       Patient participation: complete - patient participated  Level of consciousness: awake and alert and responsive to verbal stimuli    Pain management: adequate  Airway patency: patent    Anesthetic complications: no  Cardiovascular status: acceptable  Respiratory status: acceptable  Hydration status: acceptable  Patient post-procedure temperature: Pt Normothermic   PONV Status: Absent

## 2023-11-16 NOTE — Anesthesia Transfer of Care (Signed)
 ANESTHESIA TRANSFER OF CARE   Billy Aguirre is a 63 y.o. ,male, Weight: 104 kg (229 lb)   had Procedure(s):  LAPAROSCOPIC CHOLECYSTECTOMY USING CHOLANGIOGRAM WITH FLUOROSCOPY  performed  11/16/23   Primary Service: Amaryllis KATHEE Denise, MD    Past Medical History:   Diagnosis Date   . Esophageal reflux    . Hypertension       Allergy History as of 11/16/23        No Known Allergies                  I completed my transfer of care / handoff to the receiving personnel during which we discussed:  Access, Airway, All key/critical aspects of case discussed, Analgesia, Antibiotics, Expectation of post procedure, Fluids/Product, Gave opportunity for questions and acknowledgement of understanding, Labs and PMHx  Report given to: Lavell [place], RN    Post Location: PACU                                                           Last OR Temp: Temperature: 36.4 C (97.5 F)  ABG:  POTASSIUM   Date Value Ref Range Status   11/08/2023 4.3 3.5 - 5.1 mmol/L Final     CALCIUM   Date Value Ref Range Status   11/08/2023 9.2 8.6 - 10.3 mg/dL Final     Calculated P Axis   Date Value Ref Range Status   11/08/2023 65 degrees Final     Calculated R Axis   Date Value Ref Range Status   11/08/2023 96 degrees Final     Calculated T Axis   Date Value Ref Range Status   11/08/2023 31 degrees Final     Airway:* No LDAs found *  Blood pressure 111/83, pulse 71, temperature 36.4 C (97.5 F), resp. rate 18, height 1.778 m (5' 10), weight 104 kg (229 lb), SpO2 98%.

## 2023-11-16 NOTE — Discharge Instructions (Addendum)
 Cough and deep breath every 30 mins or more.    Do not remove steri strips.  They will come off on their own.    Leave gauze dressing to umbilicus on for 5 days and then remove. Wash with soap and water , pat dry, apply ointment and cover with gauze    May start Daily showers tomorrow evening.     No heavy lifting or straining greater than 20 pounds.    Gas X over the counter for gas discomfort    Call for problems, questions, concerns. Such as increased pain, foul smelling drainage or a fever of 100.4 or greater    Follow a low fat diet for one month, then introduce fatty foods one at a time.     Resume home meds.    Rx for home sent in to your pharmacy.

## 2023-11-16 NOTE — OR Surgeon (Signed)
 [facility]      Patient Name: Billy Aguirre, Billy Aguirre Number: [id number]  Date of Service: 11/16/2023   Date of Birth: 10-26-1960      Pre-Operative Diagnosis: Gallstones [K80.20]     Post-Operative Diagnosis: Gallstones    Procedure(s)/Description:  LAPAROSCOPIC CHOLECYSTECTOMY USING CHOLANGIOGRAM WITH FLUOROSCOPY: 52436 (CPT)       Attending Surgeon: Amaryllis KATHEE Denise, MD     Assistant Surgeon: na    Anesthesia:  Anesthesiologist: Charlott Agent, MD  CRNA: Leellen Countryman, CRNA    Anesthesia Type: .General     First Assist: None     Estimated Blood Loss: Minimal    Risks, benefits, indications, and complications of laparoscopic possible open cholecystectomy were discussed in detail including but not limited to conversion to open procedure, common bile duct injury, bleeding, retained stone, postoperative bile leak, postoperative hernia, postoperative diarrhea, reaction to anesthesia, injury to associated or surrounding organs, wound infection, and remote possibility of death.  Also discussed the possibility of lack of resolution of all preoperative symptoms from the procedure.  The patient was given ample opportunity to ask questions and all questions were answered to their satisfaction. The patient voices understanding of the procedure and wishes to proceed with surgery.  Informed consent clearly obtained.    The patient was brought into the operating room and placed on the table in the supine position. After general endotracheal anesthesia was provided, the abdomen was prepped and draped in a sterile manner. Local anesthetic was infiltrated into the infraumbilical region, and an incision was then made in the infraumbilical area. The umbilical ligament was grasped with a Kocher clamp, and the Veress needle was inserted into the abdomen without any difficulty. The CO2 was connected, and the abdomen was insufflated with CO2. After adequate insufflation, a 10 mm non bladed  trocar was inserted  without any difficulty.The laparoscope was inserted into the infraumbilical port and general examination of the abdomen was performed. The subxiphoid trocar was then inserted after infiltrating the skin and fascia with local anesthetic. A non bladed 5 mm trocar was then inserted under direct visualization without any difficulty. Two 5 mm trocars were then inserted, one in the right upper abdomen half-way between the costal arch and the umbilicus along the mid clavicular line. The second trocar was inserted on the right lateral abdomen along the anterior axillary line under direct visualization. Local anesthetic was infiltrated at the port sites for post operative analgesia. No difficulty in inserting these trocars was encountered. Locking grasping forceps were then placed through each of these 5 mm trocars. The more lateral forceps was used to grasp the fundus of the gallbladder and the more medial forceps was used to grasp Hartmann's pouch. Careful dissection was then carried out to remove the adjacent omentum and adipose tissue from the gallbladder. Identification of the cystic artery and cystic duct was performed, and then careful dissection to expose the cystic duct and cystic artery was performed. The Critical View was visualized.  After an adequate length of the cystic duct and cystic artery were exposed, an Hem-o-Lok clip was placed on the cystic duct proximal to the gallbladder. A Hem-o-lok clips was also placed on the cystic artery proximal to the gallbladder, and two Hem-o-lok clips were placed on the cystic artery distal to the gallbladder. The cystic artery was transected without any difficulty. An opening in the cystic duct was then made, and cholangiogram catheter was inserted into the cystic duct. Intraoperative cholangiogram was then performed. After the  cholangiogram was performed, the cystic duct distal to the gallbladder was clipped with two Hem-o-lok clips. The cystic duct was then transected.  The gallbladder was then carefully dissected from the liver bed using spatula cautery. Examination of the liver bed as dissection proceeded showed no evidence of any bleeding, and hemostasis throughout the entire procedure was well maintained. The gallbladder was subsequently removed from the liver bed and then removed from the abdominal cavity through the infraumbilical site. The subphrenic and subhepatic spaces were irrigated with irrigating solution and suctioned dry. No evidence of any bleeding was encountered. Each of the trocar sites were then examined as the trocars were removed from the abdominal wall. The infraumbilical site was closed with 0 Vicryl suture to close the fascia. Each incision was then irrigated with saline solution, dried. Each incision was then closed with 5-0 Vicryl suture in a subcuticular fashion. The areas were cleaned and dried. Steri-strips were applied over the incisions and the vacuum sealed op site dressing was applied on the umbilical incision site. The patient was then extubated and taken from the operating room to the recovery room in stable condition.  Intra-operative cholangiography was attempted in order to fully evaluate the biliary system, its anatomy and to identify the presence of common duct stones. However, the contrast was leaking out and I therefore did not proceed with further attempts.  Layman Gully B. Meylin Stenzel, MD, MBA, FACS  [facility] -General Surgery

## 2023-11-16 NOTE — Anesthesia Preprocedure Evaluation (Addendum)
 ANESTHESIA PRE-OP EVALUATION  Planned Procedure: LAPAROSCOPIC CHOLECYSTECTOMY USING CHOLANGIOGRAM WITH FLUOROSCOPY; POSSIBLE OPEN (Abdomen)  Review of Systems     anesthesia history negative     patient summary reviewed          Pulmonary   rescue inhaler,   Cardiovascular    Hypertension, valvular problems/murmurs (TVR), CAD, 2023 Left ventricular systolic function is normal.  The left ventricular ejection fraction by visual assessment is estimated to be 55-60%.  Abnormal diastolic function, elevated filling pressure.  Normal right ventricular systolic function.  The left atrium is normal in size.  The mitral valve is normal.  Trace tricuspid regurgitation present.  The aortic cusps appear mildly thickened.  There is mild aortic stenosis.aortic valve mean gradient is 11 mm hg. and aortic valve area 1.08 cm2.      and CABG ,No peripheral edema,  Exercise Tolerance: > or = 4 METS   ,beta blocker therapy  ,taken in last 24 hours     GI/Hepatic/Renal    GERD        Endo/Other    obesity and drug induced coagulopathy (plavix),      Neuro/Psych/MS   negative neuro/psych ROS,      Cancer    negative hematology/oncology ROS,                     Physical Assessment      Airway       Mallampati: II    TM distance: 3 FB    Neck ROM: full  Mouth Opening: good.  Facial hair          Dental           (+) upper dentures           Pulmonary    Breath sounds clear to auscultation  (-) no rhonchi, no decreased breath sounds, no wheezes, no rales and no stridor     Cardiovascular    Rhythm: regular  Rate: Normal  (-) no friction rub, carotid bruit is not present, no peripheral edema and no murmur     Other findings              Plan  ASA 3     Planned anesthesia type: general     general anesthesia with endotracheal tube intubation                    Intravenous induction     Anesthesia issues/risks discussed are: PONV, Dental Injuries, Cardiac Events/MI, Intraoperative Awareness/ Recall, Stroke and Aspiration.  Anesthetic plan and  risks discussed with patient  signed consent obtained          Patient's NPO status is appropriate for Anesthesia.

## 2023-11-16 NOTE — Nurses Notes (Signed)
 Pt transported to day surgery room #12 via stretcher by this nurse.   Upon arrival bedside report given to Elenor Slight, RN.   PIV infusing without difficulty.  Dressing to umbilicus has scant amount of drainage.  Pt moved over to day surgery bed without difficulty.  Vital signs as follows:    Blood pressure:  140/83  Heart rate:  69  Oxygen:  96 2 liters nc  Respirations:  19  Temperature:  97.3    No futhur questions by receiving nurse.

## 2023-11-17 ENCOUNTER — Telehealth (INDEPENDENT_AMBULATORY_CARE_PROVIDER_SITE_OTHER): Payer: Self-pay | Admitting: Surgery

## 2023-11-17 DIAGNOSIS — K8010 Calculus of gallbladder with chronic cholecystitis without obstruction: Secondary | ICD-10-CM

## 2023-11-17 LAB — SURGICAL PATHOLOGY SPECIMEN

## 2023-12-11 ENCOUNTER — Other Ambulatory Visit: Payer: Self-pay

## 2023-12-12 ENCOUNTER — Ambulatory Visit (INDEPENDENT_AMBULATORY_CARE_PROVIDER_SITE_OTHER): Admitting: Surgery

## 2023-12-12 ENCOUNTER — Encounter (INDEPENDENT_AMBULATORY_CARE_PROVIDER_SITE_OTHER): Payer: Self-pay | Admitting: Surgery

## 2023-12-12 VITALS — BP 111/83 | HR 99 | Wt 229.0 lb

## 2023-12-12 DIAGNOSIS — Z48815 Encounter for surgical aftercare following surgery on the digestive system: Secondary | ICD-10-CM

## 2023-12-12 DIAGNOSIS — Z9889 Other specified postprocedural states: Secondary | ICD-10-CM

## 2023-12-12 NOTE — Progress Notes (Signed)
 GENERAL SURGERY, [facility] MEDICAL GROUP GENERAL SURGERY  [address]  [place] [address]       Name: Billy Aguirre MRN:  [id number]   Date: 12/12/2023 Age: 63 y.o. 08-Feb-1961      PCP: Manus Endo, MD     Subjective  Billy Aguirre is a 63 y.o. year old male who presents for follow up of laparoscopic cholecystectomy with cholangiogram on 11/16/2023.  The patient is pleased with their postoperative progress at this point.  They have had appropriate incisional discomfort but well controlled with medications.  The patient is eating well and using the bathroom without any difficulty. Their activity is improving as well.  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, gallstones and gallbladder erosion    Patient Active Problem List    Diagnosis Date Noted    Coronary artery disease involving native coronary artery of native heart without angina pectoris 01/12/2023    S/P CABG (coronary artery bypass graft) 01/12/2023    Gastroesophageal reflux disease without esophagitis 01/12/2023    Primary hypertension 01/12/2023        Current Outpatient Medications   Medication Sig    albuterol  sulfate (PROAIR  HFA) 90 mcg/actuation Inhalation oral inhaler Take 1-2 Puffs by inhalation Every 4 hours as needed    aspirin (ECOTRIN) 81 mg Oral Tablet, Delayed Release (E.C.) Take 1 Tablet (81 mg total) by mouth Daily    atorvastatin (LIPITOR) 40 mg Oral Tablet Take 1 Tablet (40 mg total) by mouth Every evening    coenzyme Q10 30 mg Oral Capsule Take 1 Capsule (30 mg total) by mouth Every evening with dinner    hydroCHLOROthiazide (MICROZIDE) 12.5 mg Oral Capsule Take 1 Capsule (12.5 mg total) by mouth Daily    HYDROcodone -acetaminophen  (NORCO) 7.5-325 mg Oral Tablet Take 1 Tablet by mouth Every 6 hours as needed for Pain    loratadine (CLARITIN) 10 mg Oral Tablet Take 1 Tablet (10 mg total) by mouth Once per day as needed    metoprolol succinate (TOPROL-XL) 25 mg Oral  Tablet Sustained Release 24 hr Take 0.5 Tablets (12.5 mg total) by mouth Twice daily    ondansetron  (ZOFRAN  ODT) 4 mg Oral Tablet, Rapid Dissolve Take 1 Tablet (4 mg total) by mouth Every 8 hours as needed for Nausea/Vomiting    pantoprazole (PROTONIX) 40 mg Oral Tablet, Delayed Release (E.C.) Take 1 Tablet (40 mg total) by mouth Daily    PLAVIX 75 mg Oral Tablet Take 1 Tablet (75 mg total) by mouth Daily    turmeric/turmeric ext/pepr ext (TURMERIC-TURMERIC EXT-PEPPER) 500-5 mg Oral Capsule Take 1 Tablet by mouth Once a day          Objective: BP 111/83   Pulse 99   Wt 104 kg (229 lb)   SpO2 95%   BMI 32.86 kg/m            General:appropriate for age. in no acute distress.    Vital signs are present above and have been reviewed by me     Head and Neck: within normal limitis  Lungs: CTA   CV: RRR  Abdomen:  Soft and nontender.  Incisions well healed  Extremities: grossly normal  Neuro: normal motor and sensory function    Assessment/Plan  Assessment/Plan   1. Post-operative state        Preoperative symptoms have resolved.  The patient was instructed to resume usual activities and diet as tolerated. The patient was pleased with the  results and they were very appreciative of the care provided.  I would be happy to see them again at any time.  Opportunity was given for questions, and none were voiced past the above discussion.  The patient is free to return at any time for further questions and or difficulties.    Devri Kreher B Latonja Bobeck, MD,MBA,FACS

## 2024-03-20 ENCOUNTER — Other Ambulatory Visit (INDEPENDENT_AMBULATORY_CARE_PROVIDER_SITE_OTHER): Payer: Self-pay | Admitting: Surgery
